# Patient Record
Sex: Male | Born: 1950 | Race: White | Hispanic: No | Smoking: Former smoker
Health system: Southern US, Community
[De-identification: ages and names within clinical notes are randomized; demographics above are authoritative.]

## PROBLEM LIST (undated history)

## (undated) DIAGNOSIS — H269 Unspecified cataract: Secondary | ICD-10-CM

## (undated) DIAGNOSIS — Z8619 Personal history of other infectious and parasitic diseases: Secondary | ICD-10-CM

## (undated) DIAGNOSIS — I10 Essential (primary) hypertension: Secondary | ICD-10-CM

## (undated) HISTORY — DX: Personal history of other infectious and parasitic diseases: Z86.19

## (undated) HISTORY — DX: Unspecified cataract: H26.9

## (undated) HISTORY — PX: EYE SURGERY: SHX253

## (undated) HISTORY — DX: Essential (primary) hypertension: I10

---

## 1999-11-16 HISTORY — PX: CHOLECYSTECTOMY: SHX55

## 2005-04-30 ENCOUNTER — Ambulatory Visit: Payer: Self-pay | Admitting: Internal Medicine

## 2015-05-01 ENCOUNTER — Ambulatory Visit (INDEPENDENT_AMBULATORY_CARE_PROVIDER_SITE_OTHER): Payer: Commercial Managed Care - HMO | Admitting: Internal Medicine

## 2015-05-01 ENCOUNTER — Encounter: Payer: Self-pay | Admitting: Internal Medicine

## 2015-05-01 ENCOUNTER — Ambulatory Visit (INDEPENDENT_AMBULATORY_CARE_PROVIDER_SITE_OTHER)
Admission: RE | Admit: 2015-05-01 | Discharge: 2015-05-01 | Disposition: A | Discharge status: Home / Self Care | Payer: Commercial Managed Care - HMO | Source: Ambulatory Visit | Attending: Internal Medicine | Admitting: Internal Medicine

## 2015-05-01 VITALS — BP 132/84 | HR 120 | Temp 100.0°F | Wt 169.0 lb

## 2015-05-01 DIAGNOSIS — R52 Pain, unspecified: Secondary | ICD-10-CM

## 2015-05-01 DIAGNOSIS — R509 Fever, unspecified: Secondary | ICD-10-CM

## 2015-05-01 DIAGNOSIS — J449 Chronic obstructive pulmonary disease, unspecified: Secondary | ICD-10-CM | POA: Diagnosis not present

## 2015-05-01 LAB — POCT RAPID STREP A (OFFICE): Rapid Strep A Screen: NEGATIVE

## 2015-05-01 LAB — POCT INFLUENZA A/B
Influenza A, POC: NEGATIVE
Influenza B, POC: NEGATIVE

## 2015-05-01 NOTE — Progress Notes (Signed)
Pre visit review using our clinic review tool, if applicable. No additional management support is needed unless otherwise documented below in the visit note. 

## 2015-05-01 NOTE — Addendum Note (Signed)
Addended by: Lurlean Nanny on: 05/01/2015 02:41 PM   Modules accepted: Orders

## 2015-05-01 NOTE — Patient Instructions (Signed)

## 2015-05-01 NOTE — Progress Notes (Signed)
Subjective:    Patient ID: Charles Smith, male    DOB: [birth year], 64 y.o.   MRN: [id number]  HPI  Pt presents to the clinic today with c/o headache, fever, chills and body aches. He reports this started 4 days ago. He denies runny nose, nasal congestion, sore throat, cough or chest congestion. He reports the headache feels like pressure in the front of his head. He denies dizziness or blurred vision. He has tried Advil and cough syrup without much relief. He has not had sick contacts that he is aware of. He did work outside all day prior to the onset of the symptoms.  Review of Systems      No past medical history on file.  Current Outpatient Prescriptions  Medication Sig Dispense Refill  . Multiple Vitamins-Minerals (CENTRUM ADULTS PO) Take 1 capsule by mouth daily.     No current facility-administered medications for this visit.    No Known Allergies  No family history on file.  History   Social History  . Marital Status: Divorced    Spouse Name: N/A  . Number of Children: N/A  . Years of Education: N/A   Occupational History  . Not on file.   Social History Main Topics  . Smoking status: Current Every Day Smoker -- 0.50 packs/day    Types: Cigarettes  . Smokeless tobacco: Not on file  . Alcohol Use: 0.0 oz/week    0 Standard drinks or equivalent per week     Comment: occasional  . Drug Use: Not on file  . Sexual Activity: Not on file   Other Topics Concern  . Not on file   Social History Narrative  . No narrative on file     Constitutional: Pt reports headache, fever, chills and body aches. Denies malaise, fatigue,or abrupt weight changes.  HEENT: Denies eye pain, eye redness, ear pain, ringing in the ears, wax buildup, runny nose, nasal congestion, bloody nose, or sore throat. Respiratory: Denies difficulty breathing, shortness of breath, cough or sputum production.   Cardiovascular: Denies chest pain, chest tightness, palpitations or swelling in  the hands or feet.  Gastrointestinal: Denies abdominal pain, bloating, constipation, diarrhea or blood in the stool.  GU: Denies urgency, frequency, pain with urination, burning sensation, blood in urine, odor or discharge.  No other specific complaints in a complete review of systems (except as listed in HPI above).  Objective:   Physical Exam  BP 132/84 mmHg  Pulse 120  Temp(Src) 100 F (37.8 C) (Oral)  Wt 169 lb (76.658 kg)  SpO2 98%   Wt Readings from Last 3 Encounters:  05/01/15 169 lb (76.658 kg)    General: Appears his stated age, well developed, well nourished in NAD. Skin: Warm, dry and intact. No rashes, lesions or ulcerations noted. HEENT: Head: normal shape and size, no sinus tendernessnoted; Eyes: sclera white, no icterus, conjunctiva pink; Ears: bilateral cerumen impaction; Nose: mucosa pink and moist, septum midline; Throat/Mouth: Teeth present, mucosa erythematous and moist, no exudate, lesions or ulcerations noted.  Neck: No adenopathy noted. Cardiovascular: Normal rate and rhythm. Diminished S1,S2 noted.  No murmur, rubs or gallops noted.  Pulmonary/Chest: Normal effort and diminshed vesicular breath sounds. No respiratory distress. No wheezes, rales or ronchi noted.        Assessment & Plan:   Fever, chills and body aches:  Rapid Flu: negative Rapid Strep: negative Will obtain chest xray to r/o pneumonia Continue Advil for now If xray is negative, likely viral, should  resolve within 7-10 days  RTC as needed or if symptoms persist or worsen

## 2015-05-12 ENCOUNTER — Telehealth: Payer: Self-pay | Admitting: Acute Care

## 2015-05-12 ENCOUNTER — Ambulatory Visit (INDEPENDENT_AMBULATORY_CARE_PROVIDER_SITE_OTHER): Payer: Commercial Managed Care - HMO | Admitting: Internal Medicine

## 2015-05-12 ENCOUNTER — Other Ambulatory Visit: Payer: Self-pay | Admitting: Acute Care

## 2015-05-12 ENCOUNTER — Encounter: Payer: Self-pay | Admitting: Internal Medicine

## 2015-05-12 VITALS — BP 148/66 | HR 109 | Temp 98.3°F | Ht 67.0 in | Wt 167.8 lb

## 2015-05-12 DIAGNOSIS — F17200 Nicotine dependence, unspecified, uncomplicated: Secondary | ICD-10-CM

## 2015-05-12 DIAGNOSIS — Z0000 Encounter for general adult medical examination without abnormal findings: Secondary | ICD-10-CM | POA: Diagnosis not present

## 2015-05-12 DIAGNOSIS — Z1211 Encounter for screening for malignant neoplasm of colon: Secondary | ICD-10-CM

## 2015-05-12 DIAGNOSIS — Z23 Encounter for immunization: Secondary | ICD-10-CM

## 2015-05-12 DIAGNOSIS — Z87891 Personal history of nicotine dependence: Secondary | ICD-10-CM

## 2015-05-12 DIAGNOSIS — Z125 Encounter for screening for malignant neoplasm of prostate: Secondary | ICD-10-CM

## 2015-05-12 DIAGNOSIS — J449 Chronic obstructive pulmonary disease, unspecified: Secondary | ICD-10-CM

## 2015-05-12 LAB — COMPREHENSIVE METABOLIC PANEL
ALBUMIN: 4.1 g/dL (ref 3.5–5.2)
ALK PHOS: 85 U/L (ref 39–117)
ALT: 37 U/L (ref 0–53)
AST: 24 U/L (ref 0–37)
BUN: 17 mg/dL (ref 6–23)
CO2: 27 meq/L (ref 19–32)
Calcium: 9.3 mg/dL (ref 8.4–10.5)
Chloride: 103 mEq/L (ref 96–112)
Creatinine, Ser: 0.88 mg/dL (ref 0.40–1.50)
GFR: 92.36 mL/min (ref >60.00)
GLUCOSE: 171 mg/dL — AB (ref 70–99)
POTASSIUM: 4.3 meq/L (ref 3.5–5.1)
SODIUM: 137 meq/L (ref 135–145)
TOTAL PROTEIN: 7.3 g/dL (ref 6.0–8.3)
Total Bilirubin: 0.6 mg/dL (ref 0.2–1.2)

## 2015-05-12 LAB — CBC
HCT: 43.4 % (ref 39.0–52.0)
Hemoglobin: 14.7 g/dL (ref 13.0–17.0)
MCHC: 33.8 g/dL (ref 30.0–36.0)
MCV: 89.7 fl (ref 78.0–100.0)
Platelets: 420 10*3/uL — ABNORMAL HIGH (ref 150.0–400.0)
RBC: 4.84 Mil/uL (ref 4.22–5.81)
RDW: 14.7 % (ref 11.5–15.5)
WBC: 13.6 10*3/uL — AB (ref 4.0–10.5)

## 2015-05-12 LAB — LIPID PANEL
CHOLESTEROL: 217 mg/dL — AB (ref 0–200)
HDL: 34.3 mg/dL — ABNORMAL LOW (ref >39.00)
NonHDL: 182.7
TRIGLYCERIDES: 353 mg/dL — AB (ref 0.0–149.0)
Total CHOL/HDL Ratio: 6
VLDL: 70.6 mg/dL — AB (ref 0.0–40.0)

## 2015-05-12 LAB — LDL CHOLESTEROL, DIRECT: Direct LDL: 121 mg/dL

## 2015-05-12 LAB — PSA, MEDICARE: PSA: 0.82 ng/ml (ref 0.10–4.00)

## 2015-05-12 NOTE — Progress Notes (Signed)
HPI  Pt presents to the clinic today to establish care. He is transferring care from [facility].  Flu: 2013 Tetanus: > 10 years ago Pneumovax: never Prevnar: never Zostovax: never PSA Screening: 2013 Colon Screening: never Vision Screening: annually Dentist: annually  Diet: He eats a lot of salads. He does not consume a lot of red meat. He eats a lot of fruits or veggies. He has been trying to cut back on soda.  Exercise: He walks twice a day, 1 mile total.   Past Medical History  Diagnosis Date  . History of chicken pox     Current Outpatient Prescriptions  Medication Sig Dispense Refill  . Multiple Vitamins-Minerals (CENTRUM ADULTS PO) Take 1 capsule by mouth daily.     No current facility-administered medications for this visit.    No Known Allergies  Family History  Problem Relation Age of Onset  . Hyperlipidemia Sister   . Heart disease Brother     heart transplant  . Kidney cancer Brother     History   Social History  . Marital Status: Divorced    Spouse Name: N/A  . Number of Children: N/A  . Years of Education: N/A   Occupational History  . Not on file.   Social History Main Topics  . Smoking status: Current Every Day Smoker -- 0.50 packs/day    Types: Cigarettes  . Smokeless tobacco: Never Used  . Alcohol Use: 0.0 oz/week    0 Standard drinks or equivalent per week     Comment: occasional  . Drug Use: No  . Sexual Activity: Not on file   Other Topics Concern  . Not on file   Social History Narrative    ROS:  Constitutional: Denies fever, malaise, fatigue, headache or abrupt weight changes.  HEENT: Denies eye pain, eye redness, ear pain, ringing in the ears, wax buildup, runny nose, nasal congestion, bloody nose, or sore throat. Respiratory: Denies difficulty breathing, shortness of breath, cough or sputum production.   Cardiovascular: Denies chest pain, chest tightness, palpitations or swelling in the hands or feet.   Gastrointestinal: Denies abdominal pain, bloating, constipation, diarrhea or blood in the stool.  GU: Denies frequency, urgency, pain with urination, blood in urine, odor or discharge. Musculoskeletal: Denies decrease in range of motion, difficulty with gait, muscle pain or joint pain and swelling.  Skin: Denies redness, rashes, lesions or ulcercations.  Neurological: Denies dizziness, difficulty with memory, difficulty with speech or problems with balance and coordination.  Psych: Denies anxiety, depression, SI/HI.  No other specific complaints in a complete review of systems (except as listed in HPI above).  PE:  BP 148/66 mmHg  Pulse 109  Temp(Src) 98.3 F (36.8 C) (Oral)  Ht 5\' 7"  (1.702 m)  Wt 167 lb 12 oz (76.091 kg)  BMI 26.27 kg/m2  SpO2 97% Wt Readings from Last 3 Encounters:  05/12/15 167 lb 12 oz (76.091 kg)  05/01/15 169 lb (76.658 kg)    General: Appears his stated age, well developed, well nourished in NAD. HEENT: Head: normal shape and size; Eyes: sclera white, no icterus, conjunctiva pink, PERRLA and EOMs intact; Ears: Tm's gray and intact, normal light reflex; Throat/Mouth: Teeth present, mucosa pink and moist, no lesions or ulcerations noted.  Neck: Neck supple, trachea midline. No masses, lumps or thyromegaly present.  Cardiovascular: Normal rate and rhythm. S1,S2 noted.  No murmur, rubs or gallops noted. No JVD or BLE edema. No carotid bruits noted. Pulmonary/Chest: Normal effort and coarse vesicular breath  sounds. No respiratory distress. No wheezes, rales or ronchi noted.  Abdomen: Soft and nontender. Normal bowel sounds, no bruits noted. No distention or masses noted. Liver, spleen and kidneys non palpable. Musculoskeletal: Normal range of motion. Strength 5/5 BUE/BLE. No difficulty with gait.  Neurological: Alert and oriented. Cranial nerves II-XII grossly intact. Coordination normal. Psychiatric: Mood and affect normal. Behavior is normal. Judgment and  thought content normal.     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

## 2015-05-12 NOTE — Addendum Note (Signed)
Addended by: Ellamae Sia on: 05/12/2015 01:33 PM   Modules accepted: Orders

## 2015-05-12 NOTE — Progress Notes (Signed)
Pre visit review using our clinic review tool, if applicable. No additional management support is needed unless otherwise documented below in the visit note. 

## 2015-05-12 NOTE — Patient Instructions (Addendum)
You are getting a Tetanus injection and Prevnar today We are checking labs today, will call you with the results  Health Maintenance A healthy lifestyle and preventative care can promote health and wellness.  Maintain regular health, dental, and eye exams.  Eat a healthy diet. Foods like vegetables, fruits, whole grains, low-fat dairy products, and lean protein foods contain the nutrients you need and are low in calories. Decrease your intake of foods high in solid fats, added sugars, and salt. Get information about a proper diet from your health care provider, if necessary.  Regular physical exercise is one of the most important things you can do for your health. Most adults should get at least 150 minutes of moderate-intensity exercise (any activity that increases your heart rate and causes you to sweat) each week. In addition, most adults need muscle-strengthening exercises on 2 or more days a week.   Maintain a healthy weight. The body mass index (BMI) is a screening tool to identify possible weight problems. It provides an estimate of body fat based on height and weight. Your health care provider can find your BMI and can help you achieve or maintain a healthy weight. For males 20 years and older:  A BMI below 18.5 is considered underweight.  A BMI of 18.5 to 24.9 is normal.  A BMI of 25 to 29.9 is considered overweight.  A BMI of 30 and above is considered obese.  Maintain normal blood lipids and cholesterol by exercising and minimizing your intake of saturated fat. Eat a balanced diet with plenty of fruits and vegetables. Blood tests for lipids and cholesterol should begin at age 1 and be repeated every 5 years. If your lipid or cholesterol levels are high, you are over age 30, or you are at high risk for heart disease, you may need your cholesterol levels checked more frequently.Ongoing high lipid and cholesterol levels should be treated with medicines if diet and exercise are not  working.  If you smoke, find out from your health care provider how to quit. If you do not use tobacco, do not start.  Lung cancer screening is recommended for adults aged 7-80 years who are at high risk for developing lung cancer because of a history of smoking. A yearly low-dose CT scan of the lungs is recommended for people who have at least a 30-pack-year history of smoking and are current smokers or have quit within the past 15 years. A pack year of smoking is smoking an average of 1 pack of cigarettes a day for 1 year (for example, a 30-pack-year history of smoking could mean smoking 1 pack a day for 30 years or 2 packs a day for 15 years). Yearly screening should continue until the smoker has stopped smoking for at least 15 years. Yearly screening should be stopped for people who develop a health problem that would prevent them from having lung cancer treatment.  If you choose to drink alcohol, do not have more than 2 drinks per day. One drink is considered to be 12 oz (360 mL) of beer, 5 oz (150 mL) of wine, or 1.5 oz (45 mL) of liquor.  Avoid the use of street drugs. Do not share needles with anyone. Ask for help if you need support or instructions about stopping the use of drugs.  High blood pressure causes heart disease and increases the risk of stroke. Blood pressure should be checked at least every 1-2 years. Ongoing high blood pressure should be treated with medicines  if weight loss and exercise are not effective.  If you are 52-83 years old, ask your health care provider if you should take aspirin to prevent heart disease.  Diabetes screening involves taking a blood sample to check your fasting blood sugar level. This should be done once every 3 years after age 37 if you are at a normal weight and without risk factors for diabetes. Testing should be considered at a younger age or be carried out more frequently if you are overweight and have at least 1 risk factor for  diabetes.  Colorectal cancer can be detected and often prevented. Most routine colorectal cancer screening begins at the age of 32 and continues through age 24. However, your health care provider may recommend screening at an earlier age if you have risk factors for colon cancer. On a yearly basis, your health care provider may provide home test kits to check for hidden blood in the stool. A small camera at the end of a tube may be used to directly examine the colon (sigmoidoscopy or colonoscopy) to detect the earliest forms of colorectal cancer. Talk to your health care provider about this at age 76 when routine screening begins. A direct exam of the colon should be repeated every 5-10 years through age 73, unless early forms of precancerous polyps or small growths are found.  People who are at an increased risk for hepatitis B should be screened for this virus. You are considered at high risk for hepatitis B if:  You were born in a country where hepatitis B occurs often. Talk with your health care provider about which countries are considered high risk.  Your parents were born in a high-risk country and you have not received a shot to protect against hepatitis B (hepatitis B vaccine).  You have HIV or AIDS.  You use needles to inject street drugs.  You live with, or have sex with, someone who has hepatitis B.  You are a man who has sex with other men (MSM).  You get hemodialysis treatment.  You take certain medicines for conditions like cancer, organ transplantation, and autoimmune conditions.  Hepatitis C blood testing is recommended for all people born from [birth year] through 1966 and any individual with known risk factors for hepatitis C.  Healthy men should no longer receive prostate-specific antigen (PSA) blood tests as part of routine cancer screening. Talk to your health care provider about prostate cancer screening.  Testicular cancer screening is not recommended for adolescents or  adult males who have no symptoms. Screening includes self-exam, a health care provider exam, and other screening tests. Consult with your health care provider about any symptoms you have or any concerns you have about testicular cancer.  Practice safe sex. Use condoms and avoid high-risk sexual practices to reduce the spread of sexually transmitted infections (STIs).  You should be screened for STIs, including gonorrhea and chlamydia if:  You are sexually active and are younger than 24 years.  You are older than 24 years, and your health care provider tells you that you are at risk for this type of infection.  Your sexual activity has changed since you were last screened, and you are at an increased risk for chlamydia or gonorrhea. Ask your health care provider if you are at risk.  If you are at risk of being infected with HIV, it is recommended that you take a prescription medicine daily to prevent HIV infection. This is called pre-exposure prophylaxis (PrEP). You are considered at  risk if:  You are a man who has sex with other men (MSM).  You are a heterosexual man who is sexually active with multiple partners.  You take drugs by injection.  You are sexually active with a partner who has HIV.  Talk with your health care provider about whether you are at high risk of being infected with HIV. If you choose to begin PrEP, you should first be tested for HIV. You should then be tested every 3 months for as long as you are taking PrEP.  Use sunscreen. Apply sunscreen liberally and repeatedly throughout the day. You should seek shade when your shadow is shorter than you. Protect yourself by wearing long sleeves, pants, a wide-brimmed hat, and sunglasses year round whenever you are outdoors.  Tell your health care provider of new moles or changes in moles, especially if there is a change in shape or color. Also, tell your health care provider if a mole is larger than the size of a pencil  eraser.  A one-time screening for abdominal aortic aneurysm (AAA) and surgical repair of large AAAs by ultrasound is recommended for men aged 59-75 years who are current or former smokers.  Stay current with your vaccines (immunizations). Document Released: 04/29/2008 Document Revised: 11/06/2013 Document Reviewed: 03/29/2011 [facility] Patient Information 2015 [place], [place]. This information is not intended to replace advice given to you by your health care provider. Make sure you discuss any questions you have with your health care provider.

## 2015-05-12 NOTE — Telephone Encounter (Signed)
I called to get Charles Smith scheduled for his Lung Cancer Screening ( referrral from Webb Silversmith, NP). Initially he was ready yo schedule the visit, but then when we were talking about his smoking history he asked me if we could postpone the appointment and scan. He said he just lost his brother from back and kidney cancer, and needed to get over that before getting scanned. I told him I would call him in 6 months to see if he was ready to be screened at that time. He said he would appreciate that. I have him on my schedule to call in 6 months. I will let Webb Silversmith know.

## 2015-05-21 ENCOUNTER — Other Ambulatory Visit (INDEPENDENT_AMBULATORY_CARE_PROVIDER_SITE_OTHER): Payer: Commercial Managed Care - HMO

## 2015-05-21 DIAGNOSIS — Z1211 Encounter for screening for malignant neoplasm of colon: Secondary | ICD-10-CM | POA: Diagnosis not present

## 2015-05-21 LAB — FECAL OCCULT BLOOD, IMMUNOCHEMICAL: Fecal Occult Bld: NEGATIVE

## 2015-05-27 ENCOUNTER — Encounter: Payer: Commercial Managed Care - HMO | Admitting: Acute Care

## 2015-08-29 ENCOUNTER — Encounter: Payer: Self-pay | Admitting: Internal Medicine

## 2015-08-29 MED ORDER — SILDENAFIL CITRATE 20 MG PO TABS: ORAL_TABLET | ORAL | Status: DC

## 2015-10-29 DIAGNOSIS — H43811 Vitreous degeneration, right eye: Secondary | ICD-10-CM | POA: Diagnosis not present

## 2015-11-13 NOTE — Telephone Encounter (Signed)
Called and spoke with patient. Patient stated this was not needed. Patient refused to make appointment. Patient stated he would call us when he was ready.  Nothing further needed at this time. Will inform Webb Silversmith of this information.

## 2015-12-22 IMAGING — CR DG CHEST 2V
2 series · 2 of 2 positions shown · non-contrast
Comparison: None.

CLINICAL DATA: Fever and chills, body aches.

EXAM:
CHEST  2 VIEW

[view not recorded (1 of 2)]
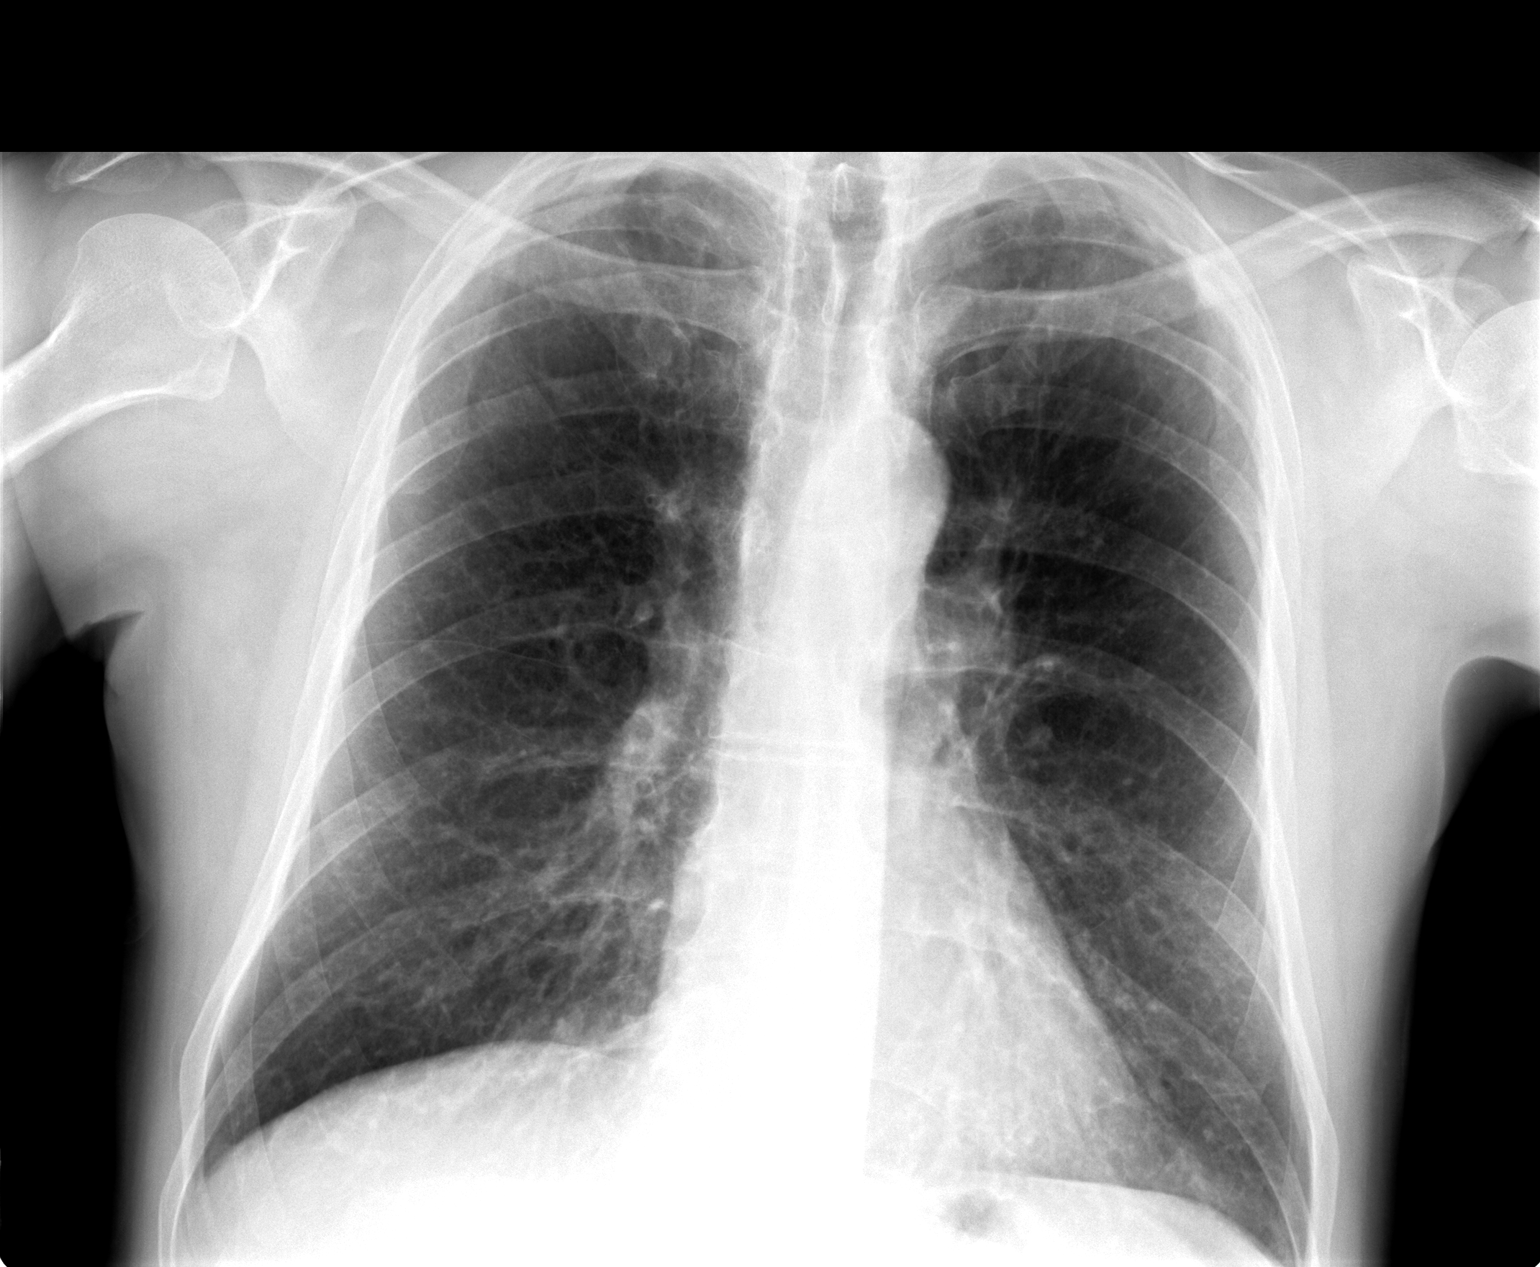

[view not recorded (2 of 2)]
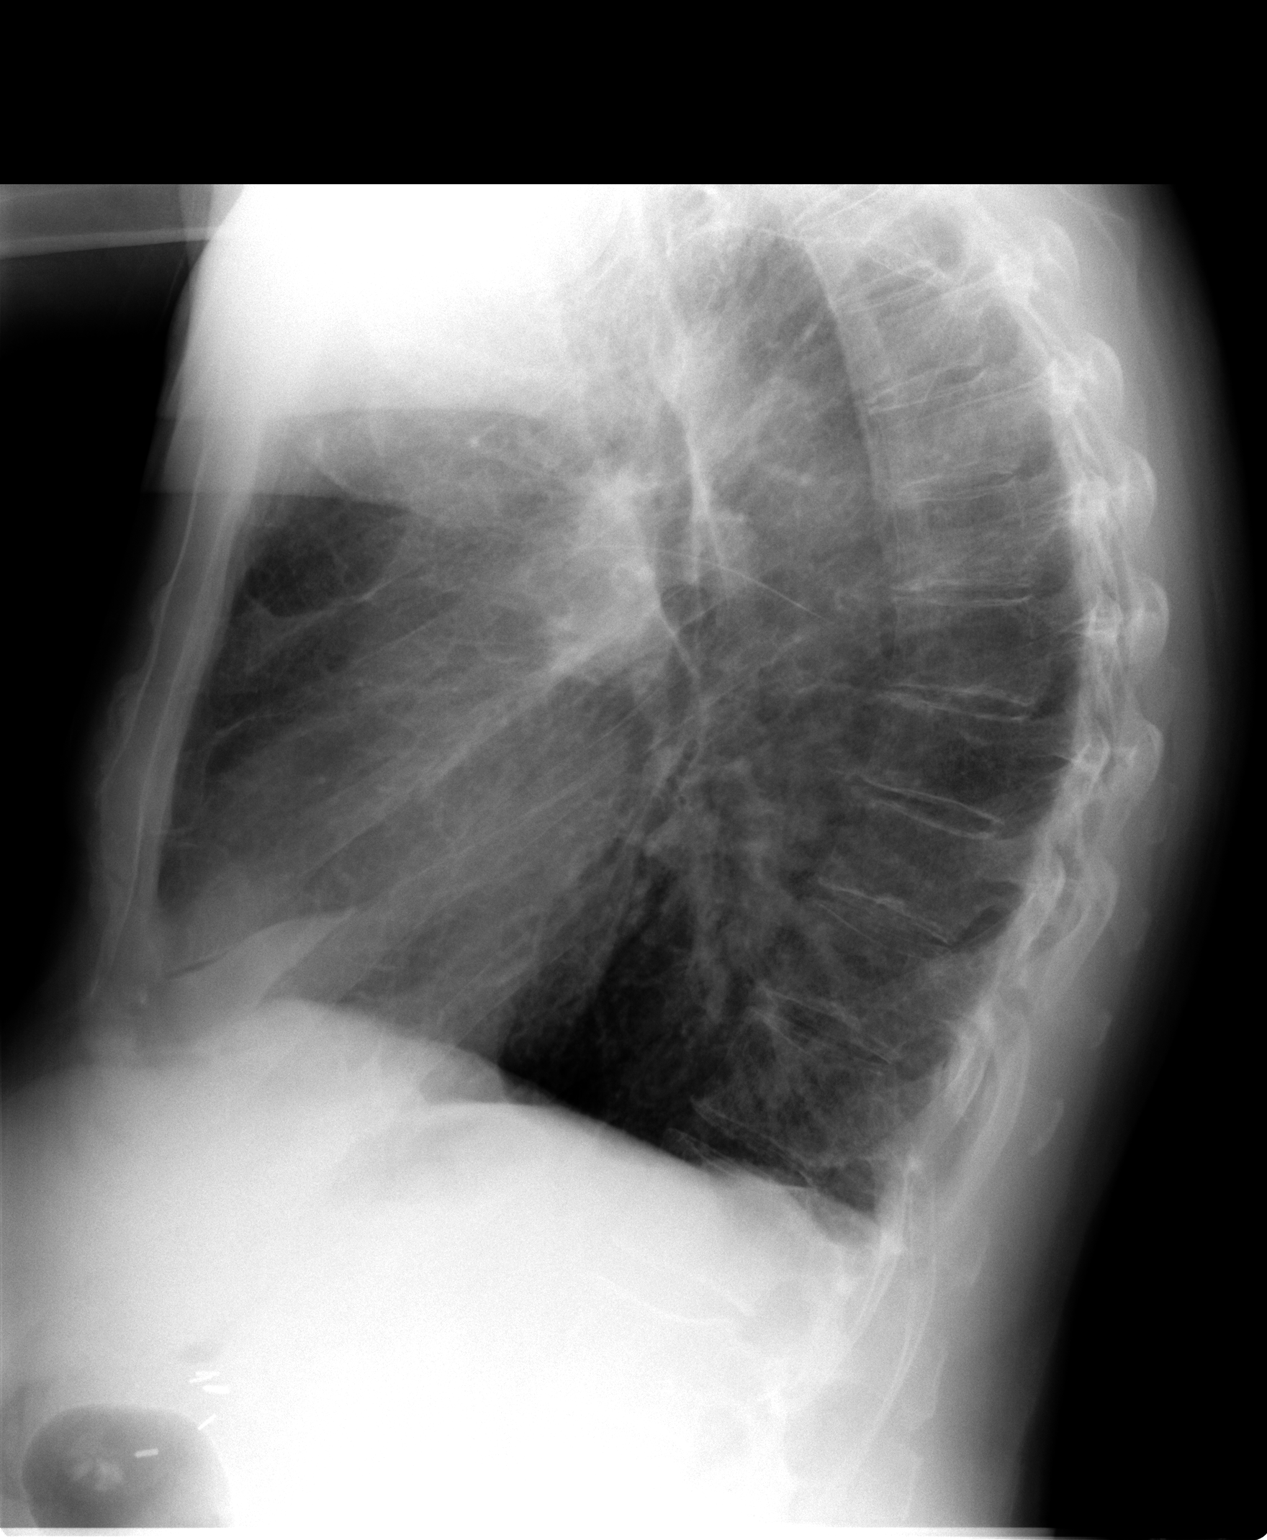

[2 of 2 positions shown; findings below may reference images not displayed]

FINDINGS: The heart size and mediastinal contours are within normal limits. No
pneumothorax or pleural effusion is noted. Mild biapical scarring is
noted. No acute pulmonary disease is noted. Hyperexpansion of the
lungs is noted suggesting chronic obstructive pulmonary disease. The
visualized skeletal structures are unremarkable.
IMPRESSION: No active cardiopulmonary disease.

## 2016-03-17 ENCOUNTER — Other Ambulatory Visit: Payer: Self-pay | Admitting: Internal Medicine

## 2016-03-18 MED ORDER — SILDENAFIL CITRATE 20 MG PO TABS: ORAL_TABLET | ORAL | Status: DC

## 2016-03-18 NOTE — Telephone Encounter (Signed)
Last filled 08/2015--last OV CPE 04/2015--please advise

## 2016-06-24 DIAGNOSIS — H52202 Unspecified astigmatism, left eye: Secondary | ICD-10-CM | POA: Diagnosis not present

## 2016-06-24 DIAGNOSIS — H5210 Myopia, unspecified eye: Secondary | ICD-10-CM | POA: Diagnosis not present

## 2016-06-24 DIAGNOSIS — H5213 Myopia, bilateral: Secondary | ICD-10-CM | POA: Diagnosis not present

## 2016-10-22 ENCOUNTER — Encounter: Payer: Commercial Managed Care - HMO | Admitting: Internal Medicine

## 2016-10-26 ENCOUNTER — Other Ambulatory Visit: Payer: Self-pay | Admitting: Internal Medicine

## 2016-10-26 DIAGNOSIS — K045 Chronic apical periodontitis: Secondary | ICD-10-CM | POA: Diagnosis not present

## 2016-12-10 ENCOUNTER — Ambulatory Visit (INDEPENDENT_AMBULATORY_CARE_PROVIDER_SITE_OTHER): Payer: Medicare HMO | Admitting: Internal Medicine

## 2016-12-10 ENCOUNTER — Encounter: Payer: Self-pay | Admitting: Internal Medicine

## 2016-12-10 VITALS — BP 168/100 | HR 85 | Temp 97.9°F | Ht 66.75 in | Wt 178.0 lb

## 2016-12-10 DIAGNOSIS — Z0000 Encounter for general adult medical examination without abnormal findings: Secondary | ICD-10-CM

## 2016-12-10 DIAGNOSIS — Z23 Encounter for immunization: Secondary | ICD-10-CM | POA: Diagnosis not present

## 2016-12-10 DIAGNOSIS — I10 Essential (primary) hypertension: Secondary | ICD-10-CM | POA: Insufficient documentation

## 2016-12-10 DIAGNOSIS — Z1159 Encounter for screening for other viral diseases: Secondary | ICD-10-CM

## 2016-12-10 DIAGNOSIS — E7800 Pure hypercholesterolemia, unspecified: Secondary | ICD-10-CM

## 2016-12-10 DIAGNOSIS — H6123 Impacted cerumen, bilateral: Secondary | ICD-10-CM

## 2016-12-10 DIAGNOSIS — D229 Melanocytic nevi, unspecified: Secondary | ICD-10-CM

## 2016-12-10 DIAGNOSIS — N529 Male erectile dysfunction, unspecified: Secondary | ICD-10-CM | POA: Insufficient documentation

## 2016-12-10 DIAGNOSIS — H00015 Hordeolum externum left lower eyelid: Secondary | ICD-10-CM

## 2016-12-10 DIAGNOSIS — J449 Chronic obstructive pulmonary disease, unspecified: Secondary | ICD-10-CM

## 2016-12-10 DIAGNOSIS — Z125 Encounter for screening for malignant neoplasm of prostate: Secondary | ICD-10-CM

## 2016-12-10 LAB — CBC
HCT: 40.8 % (ref 39.0–52.0)
Hemoglobin: 14.1 g/dL (ref 13.0–17.0)
MCHC: 34.5 g/dL (ref 30.0–36.0)
MCV: 89.4 fl (ref 78.0–100.0)
Platelets: 280 10*3/uL (ref 150.0–400.0)
RBC: 4.56 Mil/uL (ref 4.22–5.81)
RDW: 14.4 % (ref 11.5–15.5)
WBC: 10.3 10*3/uL (ref 4.0–10.5)

## 2016-12-10 LAB — LIPID PANEL
CHOL/HDL RATIO: 7
Cholesterol: 289 mg/dL — ABNORMAL HIGH (ref 0–200)
HDL: 43.2 mg/dL (ref >39.00)
NONHDL: 246.01
Triglycerides: 395 mg/dL — ABNORMAL HIGH (ref 0.0–149.0)
VLDL: 79 mg/dL — ABNORMAL HIGH (ref 0.0–40.0)

## 2016-12-10 LAB — COMPREHENSIVE METABOLIC PANEL
ALBUMIN: 4.7 g/dL (ref 3.5–5.2)
ALT: 29 U/L (ref 0–53)
AST: 25 U/L (ref 0–37)
Alkaline Phosphatase: 69 U/L (ref 39–117)
BUN: 22 mg/dL (ref 6–23)
CHLORIDE: 106 meq/L (ref 96–112)
CO2: 28 meq/L (ref 19–32)
CREATININE: 0.98 mg/dL (ref 0.40–1.50)
Calcium: 9.9 mg/dL (ref 8.4–10.5)
GFR: 81.17 mL/min (ref >60.00)
GLUCOSE: 96 mg/dL (ref 70–99)
Potassium: 5 mEq/L (ref 3.5–5.1)
SODIUM: 141 meq/L (ref 135–145)
Total Bilirubin: 0.7 mg/dL (ref 0.2–1.2)
Total Protein: 7.7 g/dL (ref 6.0–8.3)

## 2016-12-10 LAB — LDL CHOLESTEROL, DIRECT: LDL DIRECT: 148 mg/dL

## 2016-12-10 LAB — PSA, MEDICARE: PSA: 0.93 ng/mL (ref 0.10–4.00)

## 2016-12-10 MED ORDER — LOSARTAN POTASSIUM 25 MG PO TABS: 25.0000 mg | ORAL_TABLET | Freq: Every day | ORAL | 0 refills | Status: DC

## 2016-12-10 NOTE — Addendum Note (Signed)
Addended by: Jearld Fenton on: 12/10/2016 03:30 PM   Modules accepted: Orders

## 2016-12-10 NOTE — Patient Instructions (Signed)

## 2016-12-10 NOTE — Assessment & Plan Note (Signed)
Improved since he stopped smoking Ok to continue Revatio prn Will monitor

## 2016-12-10 NOTE — Progress Notes (Signed)
HPI:  Pt presents to the clinic today for his Medicare Wellness Exam. He is also due for follow up of chronic conditions:  COLD: He denies cough or shortness of breath. He is not currently using any inhalers OTC. He quit smoking the day before thanksgiving.  ED: He reports this has actually gotten better since he stopped smoking. He is taking Revatio as needed.  Elevated blood Pressure: He reports he has never been diagnosed with HTN. His BP today is 168/100. Previous BP's are 148/66, 132/84. He denies chest pain, chest tightness or shorntess of breath.  Past Medical History:  Diagnosis Date  . History of chicken pox     Current Outpatient Prescriptions  Medication Sig Dispense Refill  . Multiple Vitamins-Minerals (CENTRUM ADULTS PO) Take 1 capsule by mouth daily.    . sildenafil (REVATIO) 20 MG tablet Take 2-5 tablets as needed for sexual encounter. MUST SCHEDULE ANNUAL EXAM 50 tablet 0   No current facility-administered medications for this visit.     No Known Allergies  Family History  Problem Relation Age of Onset  . Hyperlipidemia Sister   . Heart disease Brother     heart transplant  . Kidney cancer Brother   . Depression Neg Hx   . Stroke Neg Hx     Social History   Social History  . Marital status: Divorced    Spouse name: N/A  . Number of children: N/A  . Years of education: N/A   Occupational History  . Not on file.   Social History Main Topics  . Smoking status: Current Every Day Smoker    Packs/day: 0.50    Years: 30.00    Types: Cigarettes  . Smokeless tobacco: Never Used  . Alcohol use 0.0 oz/week     Comment: occasional  . Drug use: No  . Sexual activity: Yes   Other Topics Concern  . Not on file   Social History Narrative  . No narrative on file    Hospitiliaztions: None  Health Maintenance:    Flu: wants one today  Tetanus: 04/2015  Pneumovax: 04/2015  Prevnar: never  Zostavax: never  PSA: 04/2015  Colon Screening: 05/2015  (IFOB)  Eye Doctor: 05/2016, Dr. Thom Chimes  Dental Exam: biannually   Providers:   PCP: Webb Silversmith, NP-C   I have personally reviewed and have noted:  1. The patient's medical and social history 2. Their use of alcohol, tobacco or illicit drugs 3. Their current medications and supplements 4. The patient's functional ability including ADL's, fall risks, home safety risks and hearing or visual impairment. 5. Diet and physical activities 6. Evidence for depression or mood disorder  Subjective:   Review of Systems:   Constitutional: Denies fever, malaise, fatigue, headache or abrupt weight changes.  HEENT: Denies eye pain, eye redness, ear pain, ringing in the ears, wax buildup, runny nose, nasal congestion, bloody nose, or sore throat. Respiratory: Denies difficulty breathing, shortness of breath, cough or sputum production.   Cardiovascular: Denies chest pain, chest tightness, palpitations or swelling in the hands or feet.  Gastrointestinal: Denies abdominal pain, bloating, constipation, diarrhea or blood in the stool.  GU: Denies urgency, frequency, pain with urination, burning sensation, blood in urine, odor or discharge. Musculoskeletal: Denies decrease in range of motion, difficulty with gait, muscle pain or joint pain and swelling.  Skin: Denies redness, rashes, lesions or ulcercations.  Neurological: Denies dizziness, difficulty with memory, difficulty with speech or problems with balance and coordination.  Psych: Denies anxiety, depression,  SI/HI.  No other specific complaints in a complete review of systems (except as listed in HPI above).  Objective:  PE:   BP (!) 164/100 (BP Location: Left Arm, Patient Position: Sitting)   Pulse 85   Temp 97.9 F (36.6 C)   Ht 5' 6.75" (1.695 m)   Wt 178 lb (80.7 kg)   SpO2 94%   BMI 28.09 kg/m   Wt Readings from Last 3 Encounters:  05/12/15 167 lb 12 oz (76.1 kg)  05/01/15 169 lb (76.7 kg)    General: Appears his stated  age, well developed, well nourished in NAD. Skin: Warm, dry and intact. He has an external stye to his left lower lid. He has a mole on his left foreram, with some black discoloration.   HEENT: Head: normal shape and size; Eyes: sclera white, no icterus, conjunctiva pink, PERRLA and EOMs intact; Ears: bilateral cerumen impaction; Throat/Mouth: Teeth present, mucosa pink and moist, no exudate, lesions or ulcerations noted.  Neck: Neck supple, trachea midline. No masses, lumps or thyromegaly present.  Cardiovascular: Normal rate and rhythm. S1,S2 noted.  No murmur, rubs or gallops noted. No JVD or BLE edema. No carotid bruits noted. Pulmonary/Chest: Normal effort and positive vesicular breath sounds. No respiratory distress. No wheezes, rales or ronchi noted.  Abdomen: Soft and nontender. Normal bowel sounds. No distention or masses noted. Liver, spleen and kidneys non palpable. Musculoskeletal: Strength 5/5 BUE/BLE. No signs of joint swelling.  Neurological: Alert and oriented. Cranial nerves II-XII grossly intact. Coordination normal.  Psychiatric: Mood and affect normal. Behavior is normal. Judgment and thought content normal.    BMET    Component Value Date/Time   NA 137 05/12/2015 1107   K 4.3 05/12/2015 1107   CL 103 05/12/2015 1107   CO2 27 05/12/2015 1107   GLUCOSE 171 (H) 05/12/2015 1107   BUN 17 05/12/2015 1107   CREATININE 0.88 05/12/2015 1107   CALCIUM 9.3 05/12/2015 1107    Lipid Panel     Component Value Date/Time   CHOL 217 (H) 05/12/2015 1107   TRIG 353.0 (H) 05/12/2015 1107   HDL 34.30 (L) 05/12/2015 1107   CHOLHDL 6 05/12/2015 1107   VLDL 70.6 (H) 05/12/2015 1107    CBC    Component Value Date/Time   WBC 13.6 (H) 05/12/2015 1107   RBC 4.84 05/12/2015 1107   HGB 14.7 05/12/2015 1107   HCT 43.4 05/12/2015 1107   PLT 420.0 (H) 05/12/2015 1107   MCV 89.7 05/12/2015 1107   MCHC 33.8 05/12/2015 1107   RDW 14.7 05/12/2015 1107    Hgb A1C No results found for:  HGBA1C    Assessment and Plan:   Medicare Annual Wellness Visit:  Diet: He does eat meat. He consumes fruits and veggies most day. He does eat some fried food. He drinks mostly Coke and water. Physical activity: Walking 2-3 times a week for 15 minutes Depression/mood screen: Negative Hearing: Intact to whispered voice Visual acuity: Grossly normal, performs annual eye exam  ADLs: Capable Fall risk: None Home safety: Good Cognitive evaluation: Intact to orientation, naming, recall and repetition EOL planning: No adv directives, full code/ I agree  Preventative Medicine: Flu and pneumovax given today. Tetanus UTD. He will call [employer] to see about coverage for shingles vaccine. He declines colonoscopy but is agreeable to Cologuard- ordered. Encouraged him to consume a balanced diet and exercise regimen. Advised him to see an eye doctor and dentist annually. Will check CBC, CMET, Lipid, PSA and Hep C  today.   Next appointment: 3 week, follow up HTN  Abnormal mole of left arm, stye of left lower lid:  He will make an appt with derm for further evaluation  Bilateral cerumen impaction:  Manual lavage by CMA Advised him to use Debrox OTC 2 x week to prevent buildup  Maclean Foister, NP

## 2016-12-10 NOTE — Assessment & Plan Note (Signed)
He has stopped smoking, congratulated him on this No intervention needed at this time Will monitor

## 2016-12-10 NOTE — Assessment & Plan Note (Signed)
New onset Discussed low salt diet and exercising to lose weight eRx for Losartan 25 mg daily  RTC in 3 weeks for follow up HTN

## 2016-12-11 LAB — HEPATITIS C ANTIBODY: HCV AB: NEGATIVE

## 2016-12-14 ENCOUNTER — Telehealth: Payer: Self-pay

## 2016-12-14 NOTE — Telephone Encounter (Signed)
Order has been faxed with demographics and insurance cards

## 2016-12-22 MED ORDER — SIMVASTATIN 10 MG PO TABS: 10.0000 mg | ORAL_TABLET | Freq: Every day | ORAL | 2 refills | Status: DC

## 2016-12-22 NOTE — Addendum Note (Signed)
Addended by: Jearld Fenton on: 12/22/2016 11:34 AM   Modules accepted: Orders

## 2017-01-03 ENCOUNTER — Encounter: Payer: Self-pay | Admitting: Internal Medicine

## 2017-01-03 ENCOUNTER — Ambulatory Visit (INDEPENDENT_AMBULATORY_CARE_PROVIDER_SITE_OTHER): Payer: Medicare HMO | Admitting: Internal Medicine

## 2017-01-03 DIAGNOSIS — I10 Essential (primary) hypertension: Secondary | ICD-10-CM

## 2017-01-03 DIAGNOSIS — Z1212 Encounter for screening for malignant neoplasm of rectum: Secondary | ICD-10-CM | POA: Diagnosis not present

## 2017-01-03 DIAGNOSIS — Z1211 Encounter for screening for malignant neoplasm of colon: Secondary | ICD-10-CM | POA: Diagnosis not present

## 2017-01-03 LAB — COLOGUARD: COLOGUARD: NEGATIVE

## 2017-01-03 MED ORDER — LOSARTAN POTASSIUM 50 MG PO TABS: 50.0000 mg | ORAL_TABLET | Freq: Every day | ORAL | 0 refills | Status: DC

## 2017-01-03 NOTE — Assessment & Plan Note (Signed)
Better but not at goal Increase Losartan to 50 mg daily, eRx sent to pharmacy  RTC in 3 weeks for HTN followup

## 2017-01-03 NOTE — Progress Notes (Signed)
Subjective:    Patient ID: Charles Smith, male    DOB: 11-07-1951, 66 y.o.   MRN: [id number]  HPI  Pt presents to the clinic today for 3 week follow up of HTN. At his last visit, his BP was 168/100. He was started on Losartan 25 mg. He has been taking the medication as prescribed. He did noticed that he wa lightheaded and had loose stools the first couple of days, but that has resolved. His BP today is 148/88. There is no ECG on file.  Review of Systems  Past Medical History:  Diagnosis Date  . History of chicken pox     Current Outpatient Prescriptions  Medication Sig Dispense Refill  . losartan (COZAAR) 25 MG tablet Take 1 tablet (25 mg total) by mouth daily. 30 tablet 0  . Multiple Vitamins-Minerals (CENTRUM ADULTS PO) Take 1 capsule by mouth daily.    . Omega-3 Fatty Acids (FISH OIL) 1000 MG CAPS Take 1,000 mg by mouth 2 (two) times daily.    . sildenafil (REVATIO) 20 MG tablet Take 2-5 tablets as needed for sexual encounter. MUST SCHEDULE ANNUAL EXAM 50 tablet 0  . simvastatin (ZOCOR) 10 MG tablet Take 1 tablet (10 mg total) by mouth at bedtime. 30 tablet 2   No current facility-administered medications for this visit.     No Known Allergies  Family History  Problem Relation Age of Onset  . Hyperlipidemia Sister   . Heart disease Brother     heart transplant  . Kidney cancer Brother   . Depression Neg Hx   . Stroke Neg Hx     Social History   Social History  . Marital status: Divorced    Spouse name: N/A  . Number of children: N/A  . Years of education: N/A   Occupational History  . Not on file.   Social History Main Topics  . Smoking status: Former Smoker    Types: Cigarettes  . Smokeless tobacco: Never Used  . Alcohol use 0.0 oz/week     Comment: occasional  . Drug use: No  . Sexual activity: Yes   Other Topics Concern  . Not on file   Social History Narrative  . No narrative on file     Constitutional: Denies fever, malaise, fatigue,  headache or abrupt weight changes.  Respiratory: Denies difficulty breathing, shortness of breath, cough or sputum production.   Cardiovascular: Denies chest pain, chest tightness, palpitations or swelling in the hands or feet.  Neurological: Denies dizziness, difficulty with memory, difficulty with speech or problems with balance and coordination.    No other specific complaints in a complete review of systems (except as listed in HPI above).     Objective:   Physical Exam   BP (!) 148/88   Pulse 89   Temp 98.1 F (36.7 C) (Oral)   Wt 182 lb (82.6 kg)   SpO2 97%   BMI 28.72 kg/m  Wt Readings from Last 3 Encounters:  01/03/17 182 lb (82.6 kg)  12/10/16 178 lb (80.7 kg)  05/12/15 167 lb 12 oz (76.1 kg)    General: Appears his stated age, in NAD. Cardiovascular: Normal rate and rhythm. S1,S2 noted.   Pulmonary/Chest: Normal effort and positive vesicular breath sounds. No respiratory distress. No wheezes, rales or ronchi noted.   BMET    Component Value Date/Time   NA 141 12/10/2016 1517   K 5.0 12/10/2016 1517   CL 106 12/10/2016 1517   CO2 28 12/10/2016 1517  GLUCOSE 96 12/10/2016 1517   BUN 22 12/10/2016 1517   CREATININE 0.98 12/10/2016 1517   CALCIUM 9.9 12/10/2016 1517    Lipid Panel     Component Value Date/Time   CHOL 289 (H) 12/10/2016 1517   TRIG 395.0 (H) 12/10/2016 1517   HDL 43.20 12/10/2016 1517   CHOLHDL 7 12/10/2016 1517   VLDL 79.0 (H) 12/10/2016 1517    CBC    Component Value Date/Time   WBC 10.3 12/10/2016 1517   RBC 4.56 12/10/2016 1517   HGB 14.1 12/10/2016 1517   HCT 40.8 12/10/2016 1517   PLT 280.0 12/10/2016 1517   MCV 89.4 12/10/2016 1517   MCHC 34.5 12/10/2016 1517   RDW 14.4 12/10/2016 1517    Hgb A1C No results found for: HGBA1C         Assessment & Plan:

## 2017-01-03 NOTE — Patient Instructions (Signed)
Hypertension Hypertension is another name for high blood pressure. High blood pressure forces your heart to work harder to pump blood. A blood pressure reading has two numbers, which includes a higher number over a lower number (example: 110/72). Follow these instructions at home:  Have your blood pressure rechecked by your doctor.  Only take medicine as told by your doctor. Follow the directions carefully. The medicine does not work as well if you skip doses. Skipping doses also puts you at risk for problems.  Do not smoke.  Monitor your blood pressure at home as told by your doctor. Contact a doctor if:  You think you are having a reaction to the medicine you are taking.  You have repeat headaches or feel dizzy.  You have puffiness (swelling) in your ankles.  You have trouble with your vision. Get help right away if:  You get a very bad headache and are confused.  You feel weak, numb, or faint.  You get chest or belly (abdominal) pain.  You throw up (vomit).  You cannot breathe very well. This information is not intended to replace advice given to you by your health care provider. Make sure you discuss any questions you have with your health care provider. Document Released: 04/19/2008 Document Revised: 04/08/2016 Document Reviewed: 08/24/2013 Elsevier Interactive Patient Education  2017 [employer] Inc[employer]  

## 2017-01-07 ENCOUNTER — Encounter: Payer: Self-pay | Admitting: Internal Medicine

## 2017-01-27 ENCOUNTER — Encounter: Payer: Self-pay | Admitting: Internal Medicine

## 2017-01-27 ENCOUNTER — Ambulatory Visit (INDEPENDENT_AMBULATORY_CARE_PROVIDER_SITE_OTHER): Payer: Medicare HMO | Admitting: Internal Medicine

## 2017-01-27 DIAGNOSIS — I10 Essential (primary) hypertension: Secondary | ICD-10-CM

## 2017-01-27 MED ORDER — LOSARTAN POTASSIUM 25 MG PO TABS: 25.0000 mg | ORAL_TABLET | Freq: Every day | ORAL | 0 refills | Status: DC

## 2017-01-27 MED ORDER — LOSARTAN POTASSIUM 50 MG PO TABS: 50.0000 mg | ORAL_TABLET | Freq: Every day | ORAL | 0 refills | Status: DC

## 2017-01-27 NOTE — Assessment & Plan Note (Signed)
Better but still not at goal Increase Losartan to 75 mg daily, new RX sent to pharmacy  RTC in 3 weeks for follow up HTN

## 2017-01-27 NOTE — Patient Instructions (Signed)
Hypertension °Hypertension is another name for high blood pressure. High blood pressure forces your heart to work harder to pump blood. This can cause problems over time. °There are two numbers in a blood pressure reading. There is a top number (systolic) over a bottom number (diastolic). It is best to have a blood pressure below 120/80. Healthy choices can help lower your blood pressure. You may need medicine to help lower your blood pressure if: °· Your blood pressure cannot be lowered with healthy choices. °· Your blood pressure is higher than 130/80. °Follow these instructions at home: °Eating and drinking  °· If directed, follow the DASH eating plan. This diet includes: °¨ Filling half of your plate at each meal with fruits and vegetables. °¨ Filling one quarter of your plate at each meal with whole grains. Whole grains include whole wheat pasta, brown rice, and whole grain bread. °¨ Eating or drinking low-fat dairy products, such as skim milk or low-fat yogurt. °¨ Filling one quarter of your plate at each meal with low-fat (lean) proteins. Low-fat proteins include fish, skinless chicken, eggs, beans, and tofu. °¨ Avoiding fatty meat, cured and processed meat, or chicken with skin. °¨ Avoiding premade or processed food. °· Eat less than 1,500 mg of salt (sodium) a day. °· Limit alcohol use to no more than 1 drink a day for nonpregnant women and 2 drinks a day for men. One drink equals 12 oz of beer, 5 oz of wine, or 1½ oz of hard liquor. °Lifestyle  °· Work with your doctor to stay at a healthy weight or to lose weight. Ask your doctor what the best weight is for you. °· Get at least 30 minutes of exercise that causes your heart to beat faster (aerobic exercise) most days of the week. This may include walking, swimming, or biking. °· Get at least 30 minutes of exercise that strengthens your muscles (resistance exercise) at least 3 days a week. This may include lifting weights or pilates. °· Do not use any  products that contain nicotine or tobacco. This includes cigarettes and e-cigarettes. If you need help quitting, ask your doctor. °· Check your blood pressure at home as told by your doctor. °· Keep all follow-up visits as told by your doctor. This is important. °Medicines  °· Take over-the-counter and prescription medicines only as told by your doctor. Follow directions carefully. °· Do not skip doses of blood pressure medicine. The medicine does not work as well if you skip doses. Skipping doses also puts you at risk for problems. °· Ask your doctor about side effects or reactions to medicines that you should watch for. °Contact a doctor if: °· You think you are having a reaction to the medicine you are taking. °· You have headaches that keep coming back (recurring). °· You feel dizzy. °· You have swelling in your ankles. °· You have trouble with your vision. °Get help right away if: °· You get a very bad headache. °· You start to feel confused. °· You feel weak or numb. °· You feel faint. °· You get very bad pain in your: °¨ Chest. °¨ Belly (abdomen). °· You throw up (vomit) more than once. °· You have trouble breathing. °Summary °· Hypertension is another name for high blood pressure. °· Making healthy choices can help lower blood pressure. If your blood pressure cannot be controlled with healthy choices, you may need to take medicine. °This information is not intended to replace advice given to you by your   health care provider. Make sure you discuss any questions you have with your health care provider. °Document Released: 04/19/2008 Document Revised: 09/29/2016 Document Reviewed: 09/29/2016 °Elsevier Interactive Patient Education © 2017 Elsevier Inc. ° °

## 2017-01-27 NOTE — Progress Notes (Signed)
Subjective:    Patient ID: Charles Smith, male    DOB: [birth year], 66 y.o.   MRN: [id number]  HPI  Pt presents to the clinic today for follow up of HTN. His BP at her last visit was 164/100. His Losartan was increased to 50 mg daily. He has been taking the medication as prescribed. He feels like his bowels have been looser since starting this higher dose. His BP today is 134/84. There is no ECG on file.  Review of Systems      Past Medical History:  Diagnosis Date  . History of chicken pox     Current Outpatient Prescriptions  Medication Sig Dispense Refill  . losartan (COZAAR) 50 MG tablet Take 1 tablet (50 mg total) by mouth daily. 30 tablet 0  . Multiple Vitamins-Minerals (CENTRUM ADULTS PO) Take 1 capsule by mouth daily.    . Omega-3 Fatty Acids (FISH OIL) 1000 MG CAPS Take 1,000 mg by mouth 2 (two) times daily.    . sildenafil (REVATIO) 20 MG tablet Take 2-5 tablets as needed for sexual encounter. MUST SCHEDULE ANNUAL EXAM 50 tablet 0  . simvastatin (ZOCOR) 10 MG tablet Take 1 tablet (10 mg total) by mouth at bedtime. 30 tablet 2   No current facility-administered medications for this visit.     No Known Allergies  Family History  Problem Relation Age of Onset  . Hyperlipidemia Sister   . Heart disease Brother     heart transplant  . Kidney cancer Brother   . Depression Neg Hx   . Stroke Neg Hx     Social History   Social History  . Marital status: Divorced    Spouse name: N/A  . Number of children: N/A  . Years of education: N/A   Occupational History  . Not on file.   Social History Main Topics  . Smoking status: Former Smoker    Types: Cigarettes  . Smokeless tobacco: Never Used  . Alcohol use 0.0 oz/week     Comment: occasional  . Drug use: No  . Sexual activity: Yes   Other Topics Concern  . Not on file   Social History Narrative  . No narrative on file     Constitutional: Denies fever, malaise, fatigue, headache or abrupt weight  changes.  Respiratory: Denies difficulty breathing, shortness of breath, cough or sputum production.   Cardiovascular: Denies chest pain, chest tightness, palpitations or swelling in the hands or feet.  Neurological: Denies dizziness, difficulty with memory, difficulty with speech or problems with balance and coordination.    No other specific complaints in a complete review of systems (except as listed in HPI above).  Objective:   Physical Exam  BP 134/84   Pulse 86   Temp 97.5 F (36.4 C) (Oral)   Wt 186 lb (84.4 kg)   SpO2 97%   BMI 29.35 kg/m  Wt Readings from Last 3 Encounters:  01/27/17 186 lb (84.4 kg)  01/03/17 182 lb (82.6 kg)  12/10/16 178 lb (80.7 kg)    General: Appears her stated age, well developed, well nourished in NAD. Cardiovascular: Normal rate and rhythm.  Pulmonary/Chest: Normal effort and positive vesicular breath sounds. No respiratory distress. No wheezes, rales or ronchi noted.  Musculoskeletal: Normal range of motion. No signs of joint swelling. No difficulty with gait.  Neurological: Alert and oriented.   BMET    Component Value Date/Time   NA 141 12/10/2016 1517   K 5.0 12/10/2016 1517  CL 106 12/10/2016 1517   CO2 28 12/10/2016 1517   GLUCOSE 96 12/10/2016 1517   BUN 22 12/10/2016 1517   CREATININE 0.98 12/10/2016 1517   CALCIUM 9.9 12/10/2016 1517    Lipid Panel     Component Value Date/Time   CHOL 289 (H) 12/10/2016 1517   TRIG 395.0 (H) 12/10/2016 1517   HDL 43.20 12/10/2016 1517   CHOLHDL 7 12/10/2016 1517   VLDL 79.0 (H) 12/10/2016 1517    CBC    Component Value Date/Time   WBC 10.3 12/10/2016 1517   RBC 4.56 12/10/2016 1517   HGB 14.1 12/10/2016 1517   HCT 40.8 12/10/2016 1517   PLT 280.0 12/10/2016 1517   MCV 89.4 12/10/2016 1517   MCHC 34.5 12/10/2016 1517   RDW 14.4 12/10/2016 1517    Hgb A1C No results found for: HGBA1C          Assessment & Plan:

## 2017-01-28 ENCOUNTER — Ambulatory Visit: Payer: Medicare HMO | Admitting: Internal Medicine

## 2017-02-18 ENCOUNTER — Ambulatory Visit (INDEPENDENT_AMBULATORY_CARE_PROVIDER_SITE_OTHER): Payer: Medicare HMO | Admitting: Internal Medicine

## 2017-02-18 ENCOUNTER — Encounter: Payer: Self-pay | Admitting: Internal Medicine

## 2017-02-18 DIAGNOSIS — I10 Essential (primary) hypertension: Secondary | ICD-10-CM

## 2017-02-18 MED ORDER — LOSARTAN POTASSIUM 100 MG PO TABS: 100.0000 mg | ORAL_TABLET | Freq: Every day | ORAL | 2 refills | Status: DC

## 2017-02-18 NOTE — Assessment & Plan Note (Signed)
Still not at goal Encouraged exercise Increase Losartan to 100 mg daily  RTC in 3 weeks for nurse visit for BP check

## 2017-02-18 NOTE — Patient Instructions (Signed)
Hypertension °Hypertension is another name for high blood pressure. High blood pressure forces your heart to work harder to pump blood. This can cause problems over time. °There are two numbers in a blood pressure reading. There is a top number (systolic) over a bottom number (diastolic). It is best to have a blood pressure below 120/80. Healthy choices can help lower your blood pressure. You may need medicine to help lower your blood pressure if: °· Your blood pressure cannot be lowered with healthy choices. °· Your blood pressure is higher than 130/80. °Follow these instructions at home: °Eating and drinking  °· If directed, follow the DASH eating plan. This diet includes: °¨ Filling half of your plate at each meal with fruits and vegetables. °¨ Filling one quarter of your plate at each meal with whole grains. Whole grains include whole wheat pasta, brown rice, and whole grain bread. °¨ Eating or drinking low-fat dairy products, such as skim milk or low-fat yogurt. °¨ Filling one quarter of your plate at each meal with low-fat (lean) proteins. Low-fat proteins include fish, skinless chicken, eggs, beans, and tofu. °¨ Avoiding fatty meat, cured and processed meat, or chicken with skin. °¨ Avoiding premade or processed food. °· Eat less than 1,500 mg of salt (sodium) a day. °· Limit alcohol use to no more than 1 drink a day for nonpregnant women and 2 drinks a day for men. One drink equals 12 oz of beer, 5 oz of wine, or 1½ oz of hard liquor. °Lifestyle  °· Work with your doctor to stay at a healthy weight or to lose weight. Ask your doctor what the best weight is for you. °· Get at least 30 minutes of exercise that causes your heart to beat faster (aerobic exercise) most days of the week. This may include walking, swimming, or biking. °· Get at least 30 minutes of exercise that strengthens your muscles (resistance exercise) at least 3 days a week. This may include lifting weights or pilates. °· Do not use any  products that contain nicotine or tobacco. This includes cigarettes and e-cigarettes. If you need help quitting, ask your doctor. °· Check your blood pressure at home as told by your doctor. °· Keep all follow-up visits as told by your doctor. This is important. °Medicines  °· Take over-the-counter and prescription medicines only as told by your doctor. Follow directions carefully. °· Do not skip doses of blood pressure medicine. The medicine does not work as well if you skip doses. Skipping doses also puts you at risk for problems. °· Ask your doctor about side effects or reactions to medicines that you should watch for. °Contact a doctor if: °· You think you are having a reaction to the medicine you are taking. °· You have headaches that keep coming back (recurring). °· You feel dizzy. °· You have swelling in your ankles. °· You have trouble with your vision. °Get help right away if: °· You get a very bad headache. °· You start to feel confused. °· You feel weak or numb. °· You feel faint. °· You get very bad pain in your: °¨ Chest. °¨ Belly (abdomen). °· You throw up (vomit) more than once. °· You have trouble breathing. °Summary °· Hypertension is another name for high blood pressure. °· Making healthy choices can help lower blood pressure. If your blood pressure cannot be controlled with healthy choices, you may need to take medicine. °This information is not intended to replace advice given to you by your   health care provider. Make sure you discuss any questions you have with your health care provider. °Document Released: 04/19/2008 Document Revised: 09/29/2016 Document Reviewed: 09/29/2016 °Elsevier Interactive Patient Education © 2017 Elsevier Inc. ° °

## 2017-02-18 NOTE — Progress Notes (Signed)
Subjective:    Patient ID: Charles Smith, male    DOB: 17-Mar-1951, 66 y.o.   MRN: [id number]  HPI  Pt presents to the clinic today to follow up HTN. At his last visit, his Losartan was increased to 75 mg daily. He has been taking the medication as prescribed. His BP today is 142/70.   Review of Systems  Past Medical History:  Diagnosis Date  . History of chicken pox     Current Outpatient Prescriptions  Medication Sig Dispense Refill  . losartan (COZAAR) 25 MG tablet Take 1 tablet (25 mg total) by mouth daily. Total of 75 mg daily. 30 tablet 0  . losartan (COZAAR) 50 MG tablet Take 1 tablet (50 mg total) by mouth daily. Total of 75 mg daily 30 tablet 0  . Multiple Vitamins-Minerals (CENTRUM ADULTS PO) Take 1 capsule by mouth daily.    . Omega-3 Fatty Acids (FISH OIL) 1000 MG CAPS Take 1,000 mg by mouth 2 (two) times daily.    . sildenafil (REVATIO) 20 MG tablet Take 2-5 tablets as needed for sexual encounter. MUST SCHEDULE ANNUAL EXAM 50 tablet 0  . simvastatin (ZOCOR) 10 MG tablet Take 1 tablet (10 mg total) by mouth at bedtime. 30 tablet 2   No current facility-administered medications for this visit.     No Known Allergies  Family History  Problem Relation Age of Onset  . Hyperlipidemia Sister   . Heart disease Brother     heart transplant  . Kidney cancer Brother   . Depression Neg Hx   . Stroke Neg Hx     Social History   Social History  . Marital status: Divorced    Spouse name: N/A  . Number of children: N/A  . Years of education: N/A   Occupational History  . Not on file.   Social History Main Topics  . Smoking status: Former Smoker    Types: Cigarettes  . Smokeless tobacco: Never Used  . Alcohol use 0.0 oz/week     Comment: occasional  . Drug use: No  . Sexual activity: Yes   Other Topics Concern  . Not on file   Social History Narrative  . No narrative on file     Constitutional: Denies fever, malaise, fatigue, headache or abrupt  weight changes.  Respiratory: Denies difficulty breathing, shortness of breath, cough or sputum production.   Cardiovascular: Denies chest pain, chest tightness, palpitations or swelling in the hands or feet.    No other specific complaints in a complete review of systems (except as listed in HPI above).     Objective:   Physical Exam   BP (!) 142/70   Pulse 97   Temp 97.8 F (36.6 C) (Oral)   Wt 187 lb 12 oz (85.2 kg)   SpO2 95%   BMI 29.63 kg/m  Wt Readings from Last 3 Encounters:  02/18/17 187 lb 12 oz (85.2 kg)  01/27/17 186 lb (84.4 kg)  01/03/17 182 lb (82.6 kg)    General: Appears his stated age, well developed, well nourished in NAD. Cardiovascular: Normal rate and rhythm. S1,S2 noted.  No murmur, rubs or gallops noted.  Pulmonary/Chest: Normal effort and positive vesicular breath sounds. No respiratory distress. No wheezes, rales or ronchi noted.  Neurological: Alert and oriented.   BMET    Component Value Date/Time   NA 141 12/10/2016 1517   K 5.0 12/10/2016 1517   CL 106 12/10/2016 1517   CO2 28 12/10/2016 1517  GLUCOSE 96 12/10/2016 1517   BUN 22 12/10/2016 1517   CREATININE 0.98 12/10/2016 1517   CALCIUM 9.9 12/10/2016 1517    Lipid Panel     Component Value Date/Time   CHOL 289 (H) 12/10/2016 1517   TRIG 395.0 (H) 12/10/2016 1517   HDL 43.20 12/10/2016 1517   CHOLHDL 7 12/10/2016 1517   VLDL 79.0 (H) 12/10/2016 1517    CBC    Component Value Date/Time   WBC 10.3 12/10/2016 1517   RBC 4.56 12/10/2016 1517   HGB 14.1 12/10/2016 1517   HCT 40.8 12/10/2016 1517   PLT 280.0 12/10/2016 1517   MCV 89.4 12/10/2016 1517   MCHC 34.5 12/10/2016 1517   RDW 14.4 12/10/2016 1517    Hgb A1C No results found for: HGBA1C         Assessment & Plan:

## 2017-02-18 NOTE — Progress Notes (Signed)
Pre visit review using our clinic review tool, if applicable. No additional management support is needed unless otherwise documented below in the visit note. 

## 2017-02-25 ENCOUNTER — Ambulatory Visit: Payer: Medicare HMO | Admitting: Internal Medicine

## 2017-03-04 ENCOUNTER — Encounter: Payer: Self-pay | Admitting: Internal Medicine

## 2017-04-08 ENCOUNTER — Other Ambulatory Visit (INDEPENDENT_AMBULATORY_CARE_PROVIDER_SITE_OTHER): Payer: Medicare HMO

## 2017-04-08 DIAGNOSIS — E7800 Pure hypercholesterolemia, unspecified: Secondary | ICD-10-CM | POA: Diagnosis not present

## 2017-04-08 NOTE — Addendum Note (Signed)
Addended by: Marchia Bond on: 04/08/2017 04:04 PM   Modules accepted: Orders

## 2017-04-09 ENCOUNTER — Other Ambulatory Visit: Payer: Self-pay | Admitting: Internal Medicine

## 2017-04-09 LAB — COMPREHENSIVE METABOLIC PANEL
ALBUMIN: 4.2 g/dL (ref 3.6–5.1)
ALK PHOS: 61 U/L (ref 40–115)
ALT: 33 U/L (ref 9–46)
AST: 28 U/L (ref 10–35)
BILIRUBIN TOTAL: 0.7 mg/dL (ref 0.2–1.2)
BUN: 22 mg/dL (ref 7–25)
CALCIUM: 9.5 mg/dL (ref 8.6–10.3)
CO2: 21 mmol/L (ref 20–31)
Chloride: 104 mmol/L (ref 98–110)
Creat: 1.14 mg/dL (ref 0.70–1.25)
Glucose, Bld: 108 mg/dL — ABNORMAL HIGH (ref 65–99)
Potassium: 4 mmol/L (ref 3.5–5.3)
Sodium: 137 mmol/L (ref 135–146)
TOTAL PROTEIN: 6.9 g/dL (ref 6.1–8.1)

## 2017-04-09 LAB — LIPID PANEL
Cholesterol: 187 mg/dL (ref <200)
HDL: 35 mg/dL — AB (ref >40)
TRIGLYCERIDES: 469 mg/dL — AB (ref <150)
Total CHOL/HDL Ratio: 5.3 Ratio — ABNORMAL HIGH (ref <5.0)

## 2017-04-12 MED ORDER — FENOFIBRATE 48 MG PO TABS: 48.0000 mg | ORAL_TABLET | Freq: Every day | ORAL | 2 refills | Status: DC

## 2017-05-12 ENCOUNTER — Encounter: Payer: Self-pay | Admitting: Internal Medicine

## 2017-05-12 ENCOUNTER — Telehealth: Payer: Self-pay

## 2017-05-12 MED ORDER — FENOFIBRATE 54 MG PO TABS: 54.0000 mg | ORAL_TABLET | Freq: Every day | ORAL | 2 refills | Status: DC

## 2017-05-12 NOTE — Addendum Note (Signed)
Addended by: Lurlean Nanny on: 05/12/2017 09:47 AM   Modules accepted: Orders

## 2017-05-12 NOTE — Telephone Encounter (Signed)
That is fine to send in Fenofibrate 54 mg # 30, 2 refills

## 2017-05-12 NOTE — Telephone Encounter (Signed)
New Rx sent to pharmacy

## 2017-05-12 NOTE — Telephone Encounter (Signed)
Charles Smith with [facility] left [facility] can no longer get fenofibrate 48 mg(no longer manufacturing) and Charles Smith wants to know if could get new rx with fenofibrate 54 mg.Please advise.

## 2017-05-27 ENCOUNTER — Other Ambulatory Visit: Payer: Self-pay | Admitting: Internal Medicine

## 2017-07-08 ENCOUNTER — Other Ambulatory Visit: Payer: Self-pay | Admitting: Internal Medicine

## 2017-07-08 DIAGNOSIS — E782 Mixed hyperlipidemia: Secondary | ICD-10-CM

## 2017-07-08 DIAGNOSIS — H5213 Myopia, bilateral: Secondary | ICD-10-CM | POA: Diagnosis not present

## 2017-08-11 ENCOUNTER — Other Ambulatory Visit: Payer: Self-pay | Admitting: Internal Medicine

## 2017-08-11 DIAGNOSIS — E782 Mixed hyperlipidemia: Secondary | ICD-10-CM

## 2017-09-22 ENCOUNTER — Other Ambulatory Visit: Payer: Self-pay | Admitting: Internal Medicine

## 2017-10-14 ENCOUNTER — Other Ambulatory Visit: Payer: Self-pay | Admitting: Internal Medicine

## 2017-10-21 ENCOUNTER — Other Ambulatory Visit (INDEPENDENT_AMBULATORY_CARE_PROVIDER_SITE_OTHER): Payer: Medicare HMO

## 2017-10-21 DIAGNOSIS — E782 Mixed hyperlipidemia: Secondary | ICD-10-CM | POA: Diagnosis not present

## 2017-10-21 LAB — LIPID PANEL
CHOLESTEROL: 207 mg/dL — AB (ref <200)
HDL: 36 mg/dL — ABNORMAL LOW (ref >40)
Non-HDL Cholesterol (Calc): 171 mg/dL (calc) — ABNORMAL HIGH (ref <130)
Total CHOL/HDL Ratio: 5.8 (calc) — ABNORMAL HIGH (ref <5.0)
Triglycerides: 420 mg/dL — ABNORMAL HIGH (ref <150)

## 2017-11-01 ENCOUNTER — Encounter: Payer: Self-pay | Admitting: *Deleted

## 2017-11-01 ENCOUNTER — Other Ambulatory Visit: Payer: Self-pay | Admitting: *Deleted

## 2017-11-17 ENCOUNTER — Other Ambulatory Visit: Payer: Self-pay | Admitting: Internal Medicine

## 2017-11-17 DIAGNOSIS — E782 Mixed hyperlipidemia: Secondary | ICD-10-CM

## 2017-12-15 ENCOUNTER — Other Ambulatory Visit: Payer: Self-pay | Admitting: Internal Medicine

## 2017-12-21 ENCOUNTER — Other Ambulatory Visit: Payer: Self-pay

## 2017-12-21 DIAGNOSIS — E782 Mixed hyperlipidemia: Secondary | ICD-10-CM

## 2017-12-21 MED ORDER — FENOFIBRATE 54 MG PO TABS: ORAL_TABLET | ORAL | 0 refills | Status: DC

## 2017-12-21 MED ORDER — SIMVASTATIN 10 MG PO TABS: 10.0000 mg | ORAL_TABLET | Freq: Every day | ORAL | 0 refills | Status: DC

## 2017-12-21 MED ORDER — LOSARTAN POTASSIUM 100 MG PO TABS: 100.0000 mg | ORAL_TABLET | Freq: Every day | ORAL | 0 refills | Status: DC

## 2017-12-21 NOTE — Addendum Note (Signed)
Addended by: Lurlean Nanny on: 12/21/2017 10:48 AM   Modules accepted: Orders

## 2018-01-02 ENCOUNTER — Encounter: Payer: Self-pay | Admitting: Internal Medicine

## 2018-01-02 ENCOUNTER — Ambulatory Visit (INDEPENDENT_AMBULATORY_CARE_PROVIDER_SITE_OTHER): Payer: Medicare HMO | Admitting: Internal Medicine

## 2018-01-02 VITALS — BP 140/84 | HR 90 | Temp 97.9°F | Ht 67.0 in | Wt 189.0 lb

## 2018-01-02 DIAGNOSIS — E782 Mixed hyperlipidemia: Secondary | ICD-10-CM | POA: Diagnosis not present

## 2018-01-02 DIAGNOSIS — Z23 Encounter for immunization: Secondary | ICD-10-CM

## 2018-01-02 DIAGNOSIS — N529 Male erectile dysfunction, unspecified: Secondary | ICD-10-CM

## 2018-01-02 DIAGNOSIS — Z125 Encounter for screening for malignant neoplasm of prostate: Secondary | ICD-10-CM | POA: Diagnosis not present

## 2018-01-02 DIAGNOSIS — I10 Essential (primary) hypertension: Secondary | ICD-10-CM

## 2018-01-02 DIAGNOSIS — J449 Chronic obstructive pulmonary disease, unspecified: Secondary | ICD-10-CM

## 2018-01-02 DIAGNOSIS — E1169 Type 2 diabetes mellitus with other specified complication: Secondary | ICD-10-CM | POA: Insufficient documentation

## 2018-01-02 DIAGNOSIS — Z0000 Encounter for general adult medical examination without abnormal findings: Secondary | ICD-10-CM | POA: Diagnosis not present

## 2018-01-02 NOTE — Progress Notes (Signed)
HPI:  Pt presents to the clinic today for his Medicare Wellness Exam. He is also due to follow up chronic conditions.  HTN: His BP today is 140/84, but he reports it was 123/70 before he came. He is taking Losartan as prescribed. There is no ECG on file.  HLD: His last triglycerides were 420, 10/2017. He is taking Simvastatin and Fenofibrate as prescribed. He tries to consume a low fat diet.  ED: He takes Revatio as needed.  COPD: He denies cough or shortness of breath.  Past Medical History:  Diagnosis Date  . History of chicken pox     Current Outpatient Medications  Medication Sig Dispense Refill  . fenofibrate 54 MG tablet TAKE 1 TABLET BY MOUTH ONCE A DAY 90 tablet 0  . losartan (COZAAR) 100 MG tablet Take 1 tablet (100 mg total) by mouth daily. 90 tablet 0  . Multiple Vitamins-Minerals (CENTRUM ADULTS PO) Take 1 capsule by mouth daily.    . Omega-3 Fatty Acids (FISH OIL) 1000 MG CAPS Take 1,000 mg by mouth 2 (two) times daily.    . sildenafil (REVATIO) 20 MG tablet Take 2-5 tablets as needed for sexual encounter. MUST SCHEDULE ANNUAL EXAM 50 tablet 0  . simvastatin (ZOCOR) 10 MG tablet Take 1 tablet (10 mg total) by mouth daily at 6 PM. 90 tablet 0   No current facility-administered medications for this visit.     No Known Allergies  Family History  Problem Relation Age of Onset  . Hyperlipidemia Sister   . Heart disease Brother        heart transplant  . Kidney cancer Brother   . Depression Neg Hx   . Stroke Neg Hx     Social History   Socioeconomic History  . Marital status: Divorced    Spouse name: Not on file  . Number of children: Not on file  . Years of education: Not on file  . Highest education level: Not on file  Social Needs  . Financial resource strain: Not on file  . Food insecurity - worry: Not on file  . Food insecurity - inability: Not on file  . Transportation needs - medical: Not on file  . Transportation needs - non-medical: Not on file   Occupational History  . Not on file  Tobacco Use  . Smoking status: Former Smoker    Types: Cigarettes  . Smokeless tobacco: Never Used  Substance and Sexual Activity  . Alcohol use: Yes    Alcohol/week: 0.0 oz    Comment: occasional  . Drug use: No  . Sexual activity: Yes  Other Topics Concern  . Not on file  Social History Narrative  . Not on file    Hospitiliaztions: None  Health Maintenance:    Flu: 11/2016  Tetanus: 04/2015  Pneumovax: 11/2016  Prevnar: 04/2015  Zostavax: never  PSA: 11/2016  Colon Screening: 12/2016  Eye Doctor: annually  Dental Exam: biannually   Providers:   PCP: Webb Silversmith, NP-C    I have personally reviewed and have noted:  1. The patient's medical and social history 2. Their use of alcohol, tobacco or illicit drugs 3. Their current medications and supplements 4. The patient's functional ability including ADL's, fall risks, home safety risks and hearing or visual impairment. 5. Diet and physical activities 6. Evidence for depression or mood disorder  Subjective:   Review of Systems:   Constitutional: Denies fever, malaise, fatigue, headache or abrupt weight changes.  HEENT: Denies eye pain, eye  redness, ear pain, ringing in the ears, wax buildup, runny nose, nasal congestion, bloody nose, or sore throat. Respiratory: Denies difficulty breathing, shortness of breath, cough or sputum production.   Cardiovascular: Denies chest pain, chest tightness, palpitations or swelling in the hands or feet.  Gastrointestinal: Denies abdominal pain, bloating, constipation, diarrhea or blood in the stool.  GU: Pt reports erectile dysfunction. Denies urgency, frequency, pain with urination, burning sensation, blood in urine, odor or discharge. Musculoskeletal: Denies decrease in range of motion, difficulty with gait, muscle pain or joint pain and swelling.  Skin: Denies redness, rashes, lesions or ulcercations.  Neurological: Denies dizziness,  difficulty with memory, difficulty with speech or problems with balance and coordination.  Psych: Denies anxiety, depression, SI/HI.  No other specific complaints in a complete review of systems (except as listed in HPI above).  Objective:  PE:   BP 140/84   Pulse 90   Temp 97.9 F (36.6 C) (Oral)   Ht 5\' 7"  (1.702 m)   Wt 189 lb (85.7 kg)   SpO2 97%   BMI 29.60 kg/m   Wt Readings from Last 3 Encounters:  02/18/17 187 lb 12 oz (85.2 kg)  01/27/17 186 lb (84.4 kg)  01/03/17 182 lb (82.6 kg)    General: Appears his stated age, obese in NAD. Skin: Warm, dry and intact. N HEENT: Head: normal shape and size; Eyes: sclera white, no icterus, conjunctiva pink, PERRLA and EOMs intact; Ears: Tm's gray and intact, normal light reflex; Throat/Mouth: Teeth present, mucosa pink and moist, no exudate, lesions or ulcerations noted.  Neck: Neck supple, trachea midline. No masses, lumps or thyromegaly present.  Cardiovascular: Normal rate and rhythm. S1,S2 noted.  No murmur, rubs or gallops noted. No JVD or BLE edema. No carotid bruits noted. Pulmonary/Chest: Normal effort and diminished vesicular breath sounds. No respiratory distress. No wheezes, rales or ronchi noted.  Abdomen: Soft and nontender. Normal bowel sounds. Ventral hernia noted. Liver, spleen and kidneys non palpable. Musculoskeletal: Strength 5/5 BUE/BLE. No signs of joint swelling.  Neurological: Alert and oriented. Cranial nerves II-XII grossly intact. Coordination normal.  Psychiatric: Mood and affect normal. Behavior is normal. Judgment and thought content normal.     BMET    Component Value Date/Time   NA 137 04/08/2017 1604   K 4.0 04/08/2017 1604   CL 104 04/08/2017 1604   CO2 21 04/08/2017 1604   GLUCOSE 108 (H) 04/08/2017 1604   BUN 22 04/08/2017 1604   CREATININE 1.14 04/08/2017 1604   CALCIUM 9.5 04/08/2017 1604    Lipid Panel     Component Value Date/Time   CHOL 207 (H) 10/21/2017 1614   TRIG 420 (H)  10/21/2017 1614   HDL 36 (L) 10/21/2017 1614   CHOLHDL 5.8 (H) 10/21/2017 1614   VLDL NOT CALC 04/08/2017 1604   [place] NOT CALC 04/08/2017 1604    CBC    Component Value Date/Time   WBC 10.3 12/10/2016 1517   RBC 4.56 12/10/2016 1517   HGB 14.1 12/10/2016 1517   HCT 40.8 12/10/2016 1517   PLT 280.0 12/10/2016 1517   MCV 89.4 12/10/2016 1517   MCHC 34.5 12/10/2016 1517   RDW 14.4 12/10/2016 1517    Hgb A1C No results found for: HGBA1C    Assessment and Plan:   Medicare Annual Wellness Visit:  Diet: He does eat meat. He consumes some fruits and veggies. He tries to avoid fried foods. He drinks mostly water, some unsweet tea. Physical activity: None Depression/mood screen: Negative  Hearing: Intact to whispered voice Visual acuity: Grossly normal, performs annual eye exam  ADLs: Capable Fall risk: None Home safety: Good Cognitive evaluation: Intact to orientation, naming, recall and repetition EOL planning: Adv directives, full code/ I agree  Preventative Medicine: Flu shot today. Tetanus, prevnar and pneumovax UTD. He declines zostovax or shingrix. Colon screening UTD. Encouraged him to consume a balanced diet and exercise regimen. Advised him to see an eye doctor and dentist annually. Will check CBC, CMET, Lipid and PSA today.   Next appointment: 1 year, Medicare Wellness Exam   Webb Silversmith, NP

## 2018-01-02 NOTE — Addendum Note (Signed)
Addended by: Lurlean Nanny on: 01/02/2018 04:19 PM   Modules accepted: Orders

## 2018-01-02 NOTE — Patient Instructions (Signed)

## 2018-01-02 NOTE — Assessment & Plan Note (Signed)
Elevated today, ? Anxiety Recheck CBC and CMET today Continue Losartan for now Reinforced DASH diet and exercise for weight loss

## 2018-01-02 NOTE — Assessment & Plan Note (Signed)
Continue Revatio prn 

## 2018-01-02 NOTE — Assessment & Plan Note (Signed)
Stable No issues off meds Will monitor

## 2018-01-02 NOTE — Assessment & Plan Note (Signed)
CMET and Lipid profile today °Encouraged him to consume a low fat diet °

## 2018-01-03 LAB — COMPREHENSIVE METABOLIC PANEL
ALT: 28 U/L (ref 0–53)
AST: 22 U/L (ref 0–37)
Albumin: 4.3 g/dL (ref 3.5–5.2)
Alkaline Phosphatase: 55 U/L (ref 39–117)
BILIRUBIN TOTAL: 0.5 mg/dL (ref 0.2–1.2)
BUN: 27 mg/dL — ABNORMAL HIGH (ref 6–23)
CALCIUM: 9.7 mg/dL (ref 8.4–10.5)
CO2: 29 meq/L (ref 19–32)
CREATININE: 1.4 mg/dL (ref 0.40–1.50)
Chloride: 104 mEq/L (ref 96–112)
GFR: 53.61 mL/min — AB (ref >60.00)
Glucose, Bld: 96 mg/dL (ref 70–99)
Potassium: 4.8 mEq/L (ref 3.5–5.1)
Sodium: 140 mEq/L (ref 135–145)
Total Protein: 7.2 g/dL (ref 6.0–8.3)

## 2018-01-03 LAB — CBC
HCT: 41.3 % (ref 39.0–52.0)
HEMOGLOBIN: 14.1 g/dL (ref 13.0–17.0)
MCHC: 34.1 g/dL (ref 30.0–36.0)
MCV: 89.5 fl (ref 78.0–100.0)
PLATELETS: 335 10*3/uL (ref 150.0–400.0)
RBC: 4.62 Mil/uL (ref 4.22–5.81)
RDW: 13.3 % (ref 11.5–15.5)
WBC: 9.9 10*3/uL (ref 4.0–10.5)

## 2018-01-03 LAB — PSA, MEDICARE: PSA: 0.96 ng/ml (ref 0.10–4.00)

## 2018-01-03 LAB — LIPID PANEL
Cholesterol: 156 mg/dL (ref 0–200)
HDL: 30 mg/dL — ABNORMAL LOW (ref >39.00)
Total CHOL/HDL Ratio: 5

## 2018-01-03 LAB — LDL CHOLESTEROL, DIRECT: LDL DIRECT: 83 mg/dL

## 2018-01-06 ENCOUNTER — Telehealth: Payer: Self-pay | Admitting: Internal Medicine

## 2018-01-06 NOTE — Telephone Encounter (Signed)
Reviewed results and physician's note with patient. He reports he is taking Fenofibrate as ordered. He stated that while he wasn't feeling well last week, he did take ibuprofen for several days. Discussed avoiding advil and increasing his water intake. Could not chart on result note as it was not forwarded to [facility].

## 2018-01-13 ENCOUNTER — Other Ambulatory Visit: Payer: Self-pay | Admitting: Internal Medicine

## 2018-01-13 DIAGNOSIS — E782 Mixed hyperlipidemia: Secondary | ICD-10-CM

## 2018-01-13 MED ORDER — FENOFIBRATE 120 MG PO TABS: 1.0000 | ORAL_TABLET | Freq: Every day | ORAL | 3 refills | Status: DC

## 2018-01-20 ENCOUNTER — Encounter: Payer: Self-pay | Admitting: Internal Medicine

## 2018-01-20 DIAGNOSIS — E782 Mixed hyperlipidemia: Secondary | ICD-10-CM

## 2018-01-23 MED ORDER — FENOFIBRATE 54 MG PO TABS: 108.0000 mg | ORAL_TABLET | Freq: Every day | ORAL | 1 refills | Status: DC

## 2018-01-23 NOTE — Telephone Encounter (Signed)
Yes, just take 2 of the 54 mg tabs

## 2018-01-23 NOTE — Addendum Note (Signed)
Addended by: Lurlean Nanny on: 01/23/2018 09:19 AM   Modules accepted: Orders

## 2018-01-23 NOTE — Telephone Encounter (Signed)
Please advise if okay to try 2 tabs of 54mg  tablet

## 2018-02-23 ENCOUNTER — Other Ambulatory Visit: Payer: Self-pay | Admitting: Internal Medicine

## 2018-02-23 DIAGNOSIS — E782 Mixed hyperlipidemia: Secondary | ICD-10-CM

## 2018-04-27 ENCOUNTER — Other Ambulatory Visit: Payer: Self-pay | Admitting: Internal Medicine

## 2018-04-27 DIAGNOSIS — E782 Mixed hyperlipidemia: Secondary | ICD-10-CM

## 2018-04-28 NOTE — Telephone Encounter (Signed)
Reminder letter for lab appt needed mailed

## 2018-05-16 ENCOUNTER — Other Ambulatory Visit (INDEPENDENT_AMBULATORY_CARE_PROVIDER_SITE_OTHER): Payer: Medicare HMO

## 2018-05-16 DIAGNOSIS — E782 Mixed hyperlipidemia: Secondary | ICD-10-CM | POA: Diagnosis not present

## 2018-05-16 LAB — LIPID PANEL
Cholesterol: 170 mg/dL (ref 0–200)
HDL: 35.6 mg/dL — ABNORMAL LOW (ref >39.00)
NONHDL: 134.38
Total CHOL/HDL Ratio: 5
Triglycerides: 257 mg/dL — ABNORMAL HIGH (ref 0.0–149.0)
VLDL: 51.4 mg/dL — AB (ref 0.0–40.0)

## 2018-05-16 LAB — LDL CHOLESTEROL, DIRECT: LDL DIRECT: 94 mg/dL

## 2018-07-04 ENCOUNTER — Other Ambulatory Visit: Payer: Self-pay | Admitting: Internal Medicine

## 2018-07-04 DIAGNOSIS — E782 Mixed hyperlipidemia: Secondary | ICD-10-CM

## 2019-02-10 ENCOUNTER — Other Ambulatory Visit: Payer: Self-pay | Admitting: Internal Medicine

## 2019-02-10 DIAGNOSIS — E782 Mixed hyperlipidemia: Secondary | ICD-10-CM

## 2019-02-13 ENCOUNTER — Other Ambulatory Visit: Payer: Self-pay

## 2019-02-13 ENCOUNTER — Encounter: Payer: Self-pay | Admitting: Internal Medicine

## 2019-02-13 ENCOUNTER — Ambulatory Visit (INDEPENDENT_AMBULATORY_CARE_PROVIDER_SITE_OTHER): Payer: Medicare HMO | Admitting: Internal Medicine

## 2019-02-13 DIAGNOSIS — I10 Essential (primary) hypertension: Secondary | ICD-10-CM

## 2019-02-13 DIAGNOSIS — N529 Male erectile dysfunction, unspecified: Secondary | ICD-10-CM

## 2019-02-13 DIAGNOSIS — R252 Cramp and spasm: Secondary | ICD-10-CM | POA: Diagnosis not present

## 2019-02-13 DIAGNOSIS — E782 Mixed hyperlipidemia: Secondary | ICD-10-CM

## 2019-02-13 NOTE — Assessment & Plan Note (Signed)
Continue Sildenafil as needed 

## 2019-02-13 NOTE — Patient Instructions (Signed)
Muscle Cramps and Spasms Muscle cramps and spasms are when muscles tighten by themselves. They usually get better within minutes. Muscle cramps are painful. They are usually stronger and last longer than muscle spasms. Muscle spasms may or may not be painful. They can last a few seconds or much longer. Cramps and spasms can affect any muscle, but they occur most often in the calf muscles of the leg. They are usually not caused by a serious problem. In many cases, the cause is not known. Some common causes include:  Doing more physical work or exercise than your body is ready for.  Using the muscles too much (overuse) by repeating certain movements too many times.  Staying in a certain position for a long time.  Playing a sport or doing an activity without preparing properly.  Using bad form or technique while playing a sport or doing an activity.  Not having enough water in your body (dehydration).  Injury.  Side effects of some medicines.  Low levels of the salts and minerals in your blood (electrolytes), such as low potassium or calcium. Follow these instructions at home: Managing pain and stiffness      Massage, stretch, and relax the muscle. Do this for many minutes at a time.  If told, put heat on tight or tense muscles as often as told by your doctor. Use the heat source that your doctor recommends, such as a moist heat pack or a heating pad. ? Place a towel between your skin and the heat source. ? Leave the heat on for 20-30 minutes. ? Remove the heat if your skin turns bright red. This is very important if you are not able to feel pain, heat, or cold. You may have a greater risk of getting burned.  If told, put ice on the affected area. This may help if you are sore or have pain after a cramp or spasm. ? Put ice in a plastic bag. ? Place a towel between your skin and the bag. ? Leave the ice on for 20 minutes, 2-3 times a day.  Try taking hot showers or baths to help  relax tight muscles. Eating and drinking  Drink enough fluid to keep your pee (urine) pale yellow.  Eat a healthy diet to help ensure that your muscles work well. This should include: ? Fruits and vegetables. ? Lean protein. ? Whole grains. ? Low-fat or nonfat dairy products. General instructions  If you are having cramps often, avoid intense exercise for several days.  Take over-the-counter and prescription medicines only as told by your doctor.  Watch for any changes in your symptoms.  Keep all follow-up visits as told by your doctor. This is important. Contact a doctor if:  Your cramps or spasms get worse or happen more often.  Your cramps or spasms do not get better with time. Summary  Muscle cramps and spasms are when muscles tighten by themselves. They usually get better within minutes.  Cramps and spasms occur most often in the calf muscles of the leg.  Massage, stretch, and relax the muscle. This may help the cramp or spasm go away.  Drink enough fluid to keep your pee (urine) pale yellow. This information is not intended to replace advice given to you by your health care provider. Make sure you discuss any questions you have with your health care provider. Document Released: 10/14/2008 Document Revised: 03/27/2018 Document Reviewed: 03/27/2018 Elsevier Interactive Patient Education  2019 [employer]vier Inc[employer]  

## 2019-02-13 NOTE — Progress Notes (Signed)
Virtual Visit via Video Note  I connected with Charles Smith on 02/13/19 at  2:15 PM EDT by a video enabled telemedicine application and verified that I am speaking with the correct person using two identifiers.   I discussed the limitations of evaluation and management by telemedicine and the availability of in person appointments. The patient expressed understanding and agreed to proceed.  History of Present Illness:  Pt due for follow up of chronic conditions.  HLD: Her last LDL was 94, triglycerides 257, 05/2018. He is taking Simvastatin and Fenofibrate as prescribed. He reports occasional cramping in legs and feet at night. He drinks 3 bottles of water per day.  He tries to consume a low fat diet.  HTN: He checks his BP at home. It usually runs 113/75- 135/79. He is taking Losartan as prescribed. He denies adverse side effects. There is no ECG on file.   ED: He takes Sildenafil as needed with good results.  Observations/Objective:  Alert and oriented x 3  NAD  Assessment and Plan:  See problem based charting  Follow Up Instructions:    I discussed the assessment and treatment plan with the patient. The patient was provided an opportunity to ask questions and all were answered. The patient agreed with the plan and demonstrated an understanding of the instructions.   The patient was advised to call back or seek an in-person evaluation if the symptoms worsen or if the condition fails to improve as anticipated.     Webb Silversmith, NP

## 2019-02-13 NOTE — Assessment & Plan Note (Addendum)
Controlled on Losartan Reinforced DASH diet and exercise for weight loss Will have him make a lab only appt for CMET Will also check Mg level given reports of muscle cramps

## 2019-02-13 NOTE — Assessment & Plan Note (Signed)
Will have him make lab only appt for CMET and Lipid profile Encouraged him to consume a low fat diet Continue Simvastatin, Fenofibrate and Fish Oil

## 2019-02-15 ENCOUNTER — Other Ambulatory Visit (INDEPENDENT_AMBULATORY_CARE_PROVIDER_SITE_OTHER): Payer: Medicare HMO

## 2019-02-15 ENCOUNTER — Other Ambulatory Visit: Payer: Self-pay

## 2019-02-15 DIAGNOSIS — E782 Mixed hyperlipidemia: Secondary | ICD-10-CM

## 2019-02-15 DIAGNOSIS — I10 Essential (primary) hypertension: Secondary | ICD-10-CM

## 2019-02-15 DIAGNOSIS — N529 Male erectile dysfunction, unspecified: Secondary | ICD-10-CM | POA: Diagnosis not present

## 2019-02-15 LAB — LIPID PANEL
Cholesterol: 180 mg/dL (ref 0–200)
HDL: 34.5 mg/dL — ABNORMAL LOW (ref >39.00)
NonHDL: 145.31
Total CHOL/HDL Ratio: 5
Triglycerides: 347 mg/dL — ABNORMAL HIGH (ref 0.0–149.0)
VLDL: 69.4 mg/dL — ABNORMAL HIGH (ref 0.0–40.0)

## 2019-02-15 LAB — COMPREHENSIVE METABOLIC PANEL
ALT: 32 U/L (ref 0–53)
AST: 29 U/L (ref 0–37)
Albumin: 4.5 g/dL (ref 3.5–5.2)
Alkaline Phosphatase: 52 U/L (ref 39–117)
BUN: 27 mg/dL — ABNORMAL HIGH (ref 6–23)
CO2: 25 mEq/L (ref 19–32)
Calcium: 9.7 mg/dL (ref 8.4–10.5)
Chloride: 103 mEq/L (ref 96–112)
Creatinine, Ser: 1.34 mg/dL (ref 0.40–1.50)
GFR: 52.88 mL/min — ABNORMAL LOW (ref >60.00)
Glucose, Bld: 110 mg/dL — ABNORMAL HIGH (ref 70–99)
Potassium: 4.2 mEq/L (ref 3.5–5.1)
Sodium: 138 mEq/L (ref 135–145)
Total Bilirubin: 0.6 mg/dL (ref 0.2–1.2)
Total Protein: 7.5 g/dL (ref 6.0–8.3)

## 2019-02-15 LAB — MAGNESIUM: Magnesium: 2 mg/dL (ref 1.5–2.5)

## 2019-02-15 LAB — LDL CHOLESTEROL, DIRECT: Direct LDL: 97 mg/dL

## 2019-02-16 ENCOUNTER — Encounter: Payer: Medicare HMO | Admitting: Internal Medicine

## 2019-02-21 MED ORDER — EZETIMIBE 10 MG PO TABS: 10.0000 mg | ORAL_TABLET | Freq: Every day | ORAL | 0 refills | Status: DC

## 2019-02-21 NOTE — Addendum Note (Signed)
Addended by: Lurlean Nanny on: 02/21/2019 03:09 PM   Modules accepted: Orders

## 2019-05-04 ENCOUNTER — Other Ambulatory Visit: Payer: Self-pay | Admitting: Internal Medicine

## 2019-06-08 ENCOUNTER — Encounter: Payer: Self-pay | Admitting: Internal Medicine

## 2019-06-08 ENCOUNTER — Ambulatory Visit (INDEPENDENT_AMBULATORY_CARE_PROVIDER_SITE_OTHER): Payer: Medicare HMO | Admitting: Internal Medicine

## 2019-06-08 ENCOUNTER — Other Ambulatory Visit: Payer: Self-pay

## 2019-06-08 VITALS — BP 140/90 | HR 97 | Temp 98.6°F | Ht 67.0 in | Wt 200.0 lb

## 2019-06-08 DIAGNOSIS — Z0000 Encounter for general adult medical examination without abnormal findings: Secondary | ICD-10-CM

## 2019-06-08 DIAGNOSIS — J410 Simple chronic bronchitis: Secondary | ICD-10-CM | POA: Diagnosis not present

## 2019-06-08 DIAGNOSIS — E782 Mixed hyperlipidemia: Secondary | ICD-10-CM

## 2019-06-08 DIAGNOSIS — Z125 Encounter for screening for malignant neoplasm of prostate: Secondary | ICD-10-CM

## 2019-06-08 DIAGNOSIS — E785 Hyperlipidemia, unspecified: Secondary | ICD-10-CM | POA: Diagnosis not present

## 2019-06-08 DIAGNOSIS — J449 Chronic obstructive pulmonary disease, unspecified: Secondary | ICD-10-CM | POA: Insufficient documentation

## 2019-06-08 DIAGNOSIS — I10 Essential (primary) hypertension: Secondary | ICD-10-CM | POA: Diagnosis not present

## 2019-06-08 DIAGNOSIS — N529 Male erectile dysfunction, unspecified: Secondary | ICD-10-CM | POA: Diagnosis not present

## 2019-06-08 NOTE — Patient Instructions (Signed)
Health Maintenance After Age 69 After age 69, you are at a higher risk for certain long-term diseases and infections as well as injuries from falls. Falls are a major cause of broken bones and head injuries in people who are older than age 69. Getting regular preventive care can help to keep you healthy and well. Preventive care includes getting regular testing and making lifestyle changes as recommended by your health care provider. Talk with your health care provider about:  Which screenings and tests you should have. A screening is a test that checks for a disease when you have no symptoms.  A diet and exercise plan that is right for you. What should I know about screenings and tests to prevent falls? Screening and testing are the best ways to find a health problem early. Early diagnosis and treatment give you the best chance of managing medical conditions that are common after age 69. Certain conditions and lifestyle choices may make you more likely to have a fall. Your health care provider may recommend:  Regular vision checks. Poor vision and conditions such as cataracts can make you more likely to have a fall. If you wear glasses, make sure to get your prescription updated if your vision changes.  Medicine review. Work with your health care provider to regularly review all of the medicines you are taking, including over-the-counter medicines. Ask your health care provider about any side effects that may make you more likely to have a fall. Tell your health care provider if any medicines that you take make you feel dizzy or sleepy.  Osteoporosis screening. Osteoporosis is a condition that causes the bones to get weaker. This can make the bones weak and cause them to break more easily.  Blood pressure screening. Blood pressure changes and medicines to control blood pressure can make you feel dizzy.  Strength and balance checks. Your health care provider may recommend certain tests to check your  strength and balance while standing, walking, or changing positions.  Foot health exam. Foot pain and numbness, as well as not wearing proper footwear, can make you more likely to have a fall.  Depression screening. You may be more likely to have a fall if you have a fear of falling, feel emotionally low, or feel unable to do activities that you used to do.  Alcohol use screening. Using too much alcohol can affect your balance and may make you more likely to have a fall. What actions can I take to lower my risk of falls? General instructions  Talk with your health care provider about your risks for falling. Tell your health care provider if: ? You fall. Be sure to tell your health care provider about all falls, even ones that seem minor. ? You feel dizzy, sleepy, or off-balance.  Take over-the-counter and prescription medicines only as told by your health care provider. These include any supplements.  Eat a healthy diet and maintain a healthy weight. A healthy diet includes low-fat dairy products, low-fat (lean) meats, and fiber from whole grains, beans, and lots of fruits and vegetables. Home safety  Remove any tripping hazards, such as rugs, cords, and clutter.  Install safety equipment such as grab bars in bathrooms and safety rails on stairs.  Keep rooms and walkways well-lit. Activity   Follow a regular exercise program to stay fit. This will help you maintain your balance. Ask your health care provider what types of exercise are appropriate for you.  If you need a cane or   walker, use it as recommended by your health care provider.  Wear supportive shoes that have nonskid soles. Lifestyle  Do not drink alcohol if your health care provider tells you not to drink.  If you drink alcohol, limit how much you have: ? 0-1 drink a day for women. ? 0-2 drinks a day for men.  Be aware of how much alcohol is in your drink. In the [place] one drink equals one typical bottle of beer (12  oz), one-half glass of wine (5 oz), or one shot of hard liquor (1 oz).  Do not use any products that contain nicotine or tobacco, such as cigarettes and e-cigarettes. If you need help quitting, ask your health care provider. Summary  Having a healthy lifestyle and getting preventive care can help to protect your health and wellness after age 69.  Screening and testing are the best way to find a health problem early and help you avoid having a fall. Early diagnosis and treatment give you the best chance for managing medical conditions that are more common for people who are older than age 69.  Falls are a major cause of broken bones and head injuries in people who are older than age 69. Take precautions to prevent a fall at home.  Work with your health care provider to learn what changes you can make to improve your health and wellness and to prevent falls. This information is not intended to replace advice given to you by your health care provider. Make sure you discuss any questions you have with your health care provider. Document Released: 09/14/2017 Document Revised: 02/22/2019 Document Reviewed: 09/14/2017 Elsevier Patient Education  2020 Elsevier Inc.  

## 2019-06-08 NOTE — Assessment & Plan Note (Signed)
Controlled off meds  Will monitor 

## 2019-06-08 NOTE — Assessment & Plan Note (Addendum)
Currently managed on Losartan.  BP in office elevated but he checks it at home and range 120s/70s. Edema noted to RLE, offered to add HCTZ to his regimen, he declined at this time says it doesn't bother him. CBC, CMP today. Reinforced DASH diet and increasing exercise.

## 2019-06-08 NOTE — Progress Notes (Signed)
HPI:  Patient presents to the clinic today for his subsequent Medicare Wellness Examination.  He is also due to follow up on chronic conditions.  HTN:  His BP today is 140/90. He checks his BP at home, runs 120/70.  He is taking Losartan as prescribed.  There is no ECG on file.  HLD:  His last LDL was.  He takes Simvastatin, Fish Oil, and Zetia as prescribed.  He does not consume a low fat diet.  ED:  He takes Sildenafil as needed for this.  COPD:  He denies cough or SOB.  He is currently not taking any medications for this. He no longer smokes.  Past Medical History:  Diagnosis Date  . History of chicken pox     Current Outpatient Medications  Medication Sig Dispense Refill  . ezetimibe (ZETIA) 10 MG tablet TAKE 1 TABLET (10 MG TOTAL) BY MOUTH DAILY. 90 tablet 0  . losartan (COZAAR) 100 MG tablet TAKE 1 TABLET EVERY DAY 90 tablet 0  . Multiple Vitamins-Minerals (CENTRUM ADULTS PO) Take 1 capsule by mouth daily.    . Omega-3 Fatty Acids (FISH OIL) 1000 MG CAPS Take 1,000 mg by mouth 2 (two) times daily.    . sildenafil (REVATIO) 20 MG tablet Take 2-5 tablets as needed for sexual encounter. MUST SCHEDULE ANNUAL EXAM 50 tablet 0  . simvastatin (ZOCOR) 10 MG tablet TAKE 1 TABLET DAILY AT 6:00PM 90 tablet 0   No current facility-administered medications for this visit.     No Known Allergies  Family History  Problem Relation Age of Onset  . Hyperlipidemia Sister   . Heart disease Brother        heart transplant  . Kidney cancer Brother   . Depression Neg Hx   . Stroke Neg Hx     Social History   Socioeconomic History  . Marital status: Divorced    Spouse name: Not on file  . Number of children: Not on file  . Years of education: Not on file  . Highest education level: Not on file  Occupational History  . Not on file  Social Needs  . Financial resource strain: Not on file  . Food insecurity    Worry: Not on file    Inability: Not on file  . Transportation needs     Medical: Not on file    Non-medical: Not on file  Tobacco Use  . Smoking status: Former Smoker    Types: Cigarettes  . Smokeless tobacco: Never Used  Substance and Sexual Activity  . Alcohol use: Yes    Alcohol/week: 0.0 standard drinks    Comment: occasional  . Drug use: No  . Sexual activity: Yes  Lifestyle  . Physical activity    Days per week: Not on file    Minutes per session: Not on file  . Stress: Not on file  Relationships  . Social Herbalist on phone: Not on file    Gets together: Not on file    Attends religious service: Not on file    Active member of club or organization: Not on file    Attends meetings of clubs or organizations: Not on file    Relationship status: Not on file  . Intimate partner violence    Fear of current or ex partner: Not on file    Emotionally abused: Not on file    Physically abused: Not on file    Forced sexual activity: Not on file  Other  Topics Concern  . Not on file  Social History Narrative  . Not on file    Hospitiliaztions: None  Health Maintenance:    Flu: 12/2017  Tetanus: 04/2015  Pneumovax: 11/2016  Prevnar: 04/2015  Zostavax: never  Shingrix: never  PSA: 11/2016  Colon Screening:  12/2016- Cologuard  Eye Doctor: annually- Dr. Quay Burow  Dental Exam: biannually   Providers:   PCP: Webb Silversmith, NP     I have personally reviewed and have noted:  1. The patient's medical and social history 2. Their use of alcohol, tobacco or illicit drugs 3. Their current medications and supplements 4. The patient's functional ability including ADL's, fall risks, home safety risks and hearing or visual impairment. 5. Diet and physical activities 6. Evidence for depression or mood disorder  Subjective:   Review of Systems:   Constitutional: Denies fever, malaise, fatigue, headache or abrupt weight changes.  HEENT: Denies eye pain, eye redness, ear pain, ringing in the ears, wax buildup, runny nose, nasal congestion,  bloody nose, or sore throat. Respiratory: Denies difficulty breathing, shortness of breath, cough or sputum production.   Cardiovascular: Denies chest pain, chest tightness, palpitations or swelling in the hands or feet.  Gastrointestinal: Denies abdominal pain, bloating, constipation, diarrhea or blood in the stool.  GU: Pt reports erectile dysfunction. Denies urgency, frequency, pain with urination, burning sensation, blood in urine, odor or discharge. Musculoskeletal: Denies decrease in range of motion, difficulty with gait, muscle pain or joint pain and swelling.  Skin: Denies redness, rashes, lesions or ulcercations.  Neurological: Denies dizziness, difficulty with memory, difficulty with speech or problems with balance and coordination.  Psych: Denies anxiety, depression, SI/HI.  No other specific complaints in a complete review of systems (except as listed in HPI above).  Objective:  PE:   BP 140/90   Pulse 97   Temp 98.6 F (37 C) (Temporal)   Ht 5\' 7"  (1.702 m)   Wt 200 lb (90.7 kg)   SpO2 97%   BMI 31.32 kg/m    Wt Readings from Last 3 Encounters:  01/02/18 189 lb (85.7 kg)  02/18/17 187 lb 12 oz (85.2 kg)  01/27/17 186 lb (84.4 kg)    General: Appears his stated age, obese,  in NAD. Skin: Warm, dry and intact. No rashes noted. HEENT: Head: normal shape and size; Eyes: sclera white, no icterus, conjunctiva pink, PERRLA and EOMs intact; Ears: Tm's gray and intact, normal light reflex;  Neck: Neck supple, trachea midline. No masses, lumps or thyromegaly present.  Cardiovascular: 1+ pitting edema noted to RLE.  Normal rate and rhythm. S1,S2 noted.  No murmur, rubs or gallops noted. No JVD. No carotid bruits noted. Pulmonary/Chest: Normal effort and positive vesicular breath sounds. No respiratory distress. No wheezes, rales or ronchi noted.  Abdomen: Distended, taut, and nontender. Normal bowel sounds. No masses noted. Liver, spleen and kidneys non  palpable. Musculoskeletal: Strength 5/5 BUE/BLE. No signs of joint swelling.  Neurological: Alert and oriented. Cranial nerves II-XII grossly intact. Coordination normal.  Psychiatric: Mood and affect normal. Behavior is normal. Judgment and thought content normal.     BMET    Component Value Date/Time   NA 138 02/15/2019 1430   K 4.2 02/15/2019 1430   CL 103 02/15/2019 1430   CO2 25 02/15/2019 1430   GLUCOSE 110 (H) 02/15/2019 1430   BUN 27 (H) 02/15/2019 1430   CREATININE 1.34 02/15/2019 1430   CREATININE 1.14 04/08/2017 1604   CALCIUM 9.7 02/15/2019 1430  Lipid Panel     Component Value Date/Time   CHOL 180 02/15/2019 1430   TRIG 347.0 (H) 02/15/2019 1430   HDL 34.50 (L) 02/15/2019 1430   CHOLHDL 5 02/15/2019 1430   VLDL 69.4 (H) 02/15/2019 1430   LDLCALC  10/21/2017 1614     Comment:     . LDL cholesterol not calculated. Triglyceride levels greater than 400 mg/dL invalidate calculated LDL results. . Reference range: <100 . Desirable range <100 mg/dL for primary prevention;   <70 mg/dL for patients with CHD or diabetic patients  with > or = 2 CHD risk factors. Marland Kitchen LDL-C is now calculated using the Martin-Hopkins  calculation, which is a validated novel method providing  better accuracy than the Friedewald equation in the  estimation of LDL-C.  Cresenciano Genre et al. Annamaria Helling. 9458;592(92): 2061-2068  (http://education.[facility].com/faq/FAQ164)     CBC    Component Value Date/Time   WBC 9.9 01/02/2018 1553   RBC 4.62 01/02/2018 1553   HGB 14.1 01/02/2018 1553   HCT 41.3 01/02/2018 1553   PLT 335.0 01/02/2018 1553   MCV 89.5 01/02/2018 1553   MCHC 34.1 01/02/2018 1553   RDW 13.3 01/02/2018 1553    Hgb A1C No results found for: HGBA1C    Assessment and Plan:   Medicare Annual Wellness Visit:  Diet: He does eat meat. He consumes fruits and veggies daily. He does eat some fried foods. He drinks mostly water, semi sweet tea. Physical activity:  Walking Depression/mood screen: Negative, PHQ 9 score of 0 Hearing: Intact to whispered voice Visual acuity: Grossly normal, performs annual eye exam  ADLs: Capable Fall risk: None Home safety: Good Cognitive evaluation: Intact to orientation, naming, recall and repetition EOL planning: Adv directives, full code/ I agree  Preventative Medicine: Encouraged him to get a flu shot in the fall. Tetanus, pneumovax and prevnar UTD. He declines Shingrix or Zostvax for financial reasons. Will repeat Cologuard in 2021. Encouraged him to consume a balanced diet and exercise regimen. Advised him to see an eye doctor and dentist annually. Will check CBC, CMET, Lipid and PSA. Dates of screenings given to pt as part of his AVS.   Next appointment: 6 months, follow up chronic conditions   Webb Silversmith, NP

## 2019-06-08 NOTE — Assessment & Plan Note (Signed)
Currently managed on Simvastatin, Fish Oil, and Zetia. Lipids checked today. Will follow up after labs result. Encouraged low fat diet and increasing exercise.

## 2019-06-08 NOTE — Assessment & Plan Note (Signed)
Continue Sildenafil as needed 

## 2019-06-09 LAB — COMPREHENSIVE METABOLIC PANEL
AG Ratio: 1.6 (calc) (ref 1.0–2.5)
ALT: 38 U/L (ref 9–46)
AST: 29 U/L (ref 10–35)
Albumin: 4.4 g/dL (ref 3.6–5.1)
Alkaline phosphatase (APISO): 61 U/L (ref 35–144)
BUN: 22 mg/dL (ref 7–25)
CO2: 22 mmol/L (ref 20–32)
Calcium: 9.8 mg/dL (ref 8.6–10.3)
Chloride: 103 mmol/L (ref 98–110)
Creat: 1.19 mg/dL (ref 0.70–1.25)
Globulin: 2.8 g/dL (calc) (ref 1.9–3.7)
Glucose, Bld: 88 mg/dL (ref 65–99)
Potassium: 4.3 mmol/L (ref 3.5–5.3)
Sodium: 139 mmol/L (ref 135–146)
Total Bilirubin: 0.9 mg/dL (ref 0.2–1.2)
Total Protein: 7.2 g/dL (ref 6.1–8.1)

## 2019-06-09 LAB — CBC
HCT: 45.6 % (ref 38.5–50.0)
Hemoglobin: 15.4 g/dL (ref 13.2–17.1)
MCH: 29.9 pg (ref 27.0–33.0)
MCHC: 33.8 g/dL (ref 32.0–36.0)
MCV: 88.5 fL (ref 80.0–100.0)
MPV: 11.1 fL (ref 7.5–12.5)
Platelets: 281 10*3/uL (ref 140–400)
RBC: 5.15 10*6/uL (ref 4.20–5.80)
RDW: 13.5 % (ref 11.0–15.0)
WBC: 9.8 10*3/uL (ref 3.8–10.8)

## 2019-06-09 LAB — LIPID PANEL
Cholesterol: 169 mg/dL (ref <200)
HDL: 38 mg/dL — ABNORMAL LOW (ref >=40)
Non-HDL Cholesterol (Calc): 131 mg/dL (calc) — ABNORMAL HIGH (ref <130)
Total CHOL/HDL Ratio: 4.4 (calc) (ref <5.0)
Triglycerides: 540 mg/dL — ABNORMAL HIGH (ref <150)

## 2019-06-09 LAB — PSA: PSA: 1 ng/mL (ref <=4.0)

## 2019-06-14 MED ORDER — ROSUVASTATIN CALCIUM 10 MG PO TABS: 10.0000 mg | ORAL_TABLET | Freq: Every day | ORAL | 0 refills | Status: DC

## 2019-06-14 NOTE — Addendum Note (Signed)
Addended by: Lurlean Nanny on: 06/14/2019 05:09 PM   Modules accepted: Orders

## 2019-08-06 ENCOUNTER — Other Ambulatory Visit: Payer: Self-pay | Admitting: Internal Medicine

## 2019-09-14 ENCOUNTER — Other Ambulatory Visit (INDEPENDENT_AMBULATORY_CARE_PROVIDER_SITE_OTHER): Payer: Medicare HMO

## 2019-09-14 DIAGNOSIS — E782 Mixed hyperlipidemia: Secondary | ICD-10-CM

## 2019-09-14 LAB — COMPREHENSIVE METABOLIC PANEL
ALT: 66 U/L — ABNORMAL HIGH (ref 0–53)
AST: 52 U/L — ABNORMAL HIGH (ref 0–37)
Albumin: 4.5 g/dL (ref 3.5–5.2)
Alkaline Phosphatase: 67 U/L (ref 39–117)
BUN: 21 mg/dL (ref 6–23)
CO2: 26 mEq/L (ref 19–32)
Calcium: 9.7 mg/dL (ref 8.4–10.5)
Chloride: 103 mEq/L (ref 96–112)
Creatinine, Ser: 1.06 mg/dL (ref 0.40–1.50)
GFR: 69.19 mL/min (ref >60.00)
Glucose, Bld: 117 mg/dL — ABNORMAL HIGH (ref 70–99)
Potassium: 4.1 mEq/L (ref 3.5–5.1)
Sodium: 139 mEq/L (ref 135–145)
Total Bilirubin: 1.1 mg/dL (ref 0.2–1.2)
Total Protein: 7.2 g/dL (ref 6.0–8.3)

## 2019-09-14 LAB — LIPID PANEL
Cholesterol: 114 mg/dL (ref 0–200)
HDL: 36.7 mg/dL — ABNORMAL LOW (ref >39.00)
NonHDL: 77.45
Total CHOL/HDL Ratio: 3
Triglycerides: 348 mg/dL — ABNORMAL HIGH (ref 0.0–149.0)
VLDL: 69.6 mg/dL — ABNORMAL HIGH (ref 0.0–40.0)

## 2019-09-14 LAB — LDL CHOLESTEROL, DIRECT: Direct LDL: 40 mg/dL

## 2019-09-21 DIAGNOSIS — H5213 Myopia, bilateral: Secondary | ICD-10-CM | POA: Diagnosis not present

## 2019-09-26 ENCOUNTER — Ambulatory Visit (INDEPENDENT_AMBULATORY_CARE_PROVIDER_SITE_OTHER): Payer: Medicare HMO

## 2019-09-26 DIAGNOSIS — Z23 Encounter for immunization: Secondary | ICD-10-CM

## 2019-10-29 ENCOUNTER — Other Ambulatory Visit: Payer: Self-pay | Admitting: Internal Medicine

## 2019-12-04 ENCOUNTER — Ambulatory Visit: Payer: Medicare HMO | Attending: Internal Medicine

## 2019-12-04 DIAGNOSIS — Z23 Encounter for immunization: Secondary | ICD-10-CM | POA: Insufficient documentation

## 2019-12-04 NOTE — Progress Notes (Signed)
   Covid-19 Vaccination Clinic  Name:  Charles Smith    MRN: [id number] DOB: [birth year]  12/04/2019  Charles Smith was observed post Covid-19 immunization for 15 minutes without incidence. He was provided with Vaccine Information Sheet and instruction to access the V-Safe system.   Charles Smith was instructed to call 911 with any severe reactions post vaccine: Marland Kitchen Difficulty breathing  . Swelling of your face and throat  . A fast heartbeat  . A bad rash all over your body  . Dizziness and weakness    Immunizations Administered    Name Date Dose VIS Date Route   Pfizer COVID-19 Vaccine 12/04/2019 11:32 AM 0.3 mL 10/26/2019 Intramuscular   Manufacturer: [place]   Lot: [id number]   [place]: [id number]

## 2019-12-24 ENCOUNTER — Ambulatory Visit: Payer: Medicare HMO | Attending: Internal Medicine

## 2019-12-24 DIAGNOSIS — Z23 Encounter for immunization: Secondary | ICD-10-CM

## 2019-12-24 NOTE — Progress Notes (Signed)
   Covid-19 Vaccination Clinic  Name:  Charles Smith    MRN: [id number] DOB: Aug 26, 1951  12/24/2019  Charles Smith was observed post Covid-19 immunization for 15 minutes without incidence. He was provided with Vaccine Information Sheet and instruction to access the V-Safe system.   Charles Smith was instructed to call 911 with any severe reactions post vaccine: Marland Kitchen Difficulty breathing  . Swelling of your face and throat  . A fast heartbeat  . A bad rash all over your body  . Dizziness and weakness    Immunizations Administered    Name Date Dose VIS Date Route   Pfizer COVID-19 Vaccine 12/24/2019  9:43 AM 0.3 mL 10/26/2019 Intramuscular   Manufacturer: [place]   Lot: [id number]   [place]: [id number]

## 2020-01-10 ENCOUNTER — Other Ambulatory Visit: Payer: Self-pay | Admitting: Internal Medicine

## 2020-01-10 NOTE — Telephone Encounter (Signed)
Pt needs to schedule lab only appt

## 2020-02-29 ENCOUNTER — Other Ambulatory Visit: Payer: Self-pay | Admitting: Internal Medicine

## 2020-05-11 ENCOUNTER — Other Ambulatory Visit: Payer: Self-pay | Admitting: Internal Medicine

## 2020-06-16 ENCOUNTER — Encounter: Payer: Medicare HMO | Admitting: Internal Medicine

## 2020-06-23 ENCOUNTER — Ambulatory Visit (INDEPENDENT_AMBULATORY_CARE_PROVIDER_SITE_OTHER): Payer: Medicare HMO | Admitting: Internal Medicine

## 2020-06-23 ENCOUNTER — Encounter: Payer: Self-pay | Admitting: Internal Medicine

## 2020-06-23 ENCOUNTER — Telehealth: Payer: Self-pay

## 2020-06-23 ENCOUNTER — Other Ambulatory Visit: Payer: Self-pay

## 2020-06-23 VITALS — BP 138/86 | HR 88 | Temp 98.1°F | Ht 67.0 in | Wt 202.0 lb

## 2020-06-23 DIAGNOSIS — N529 Male erectile dysfunction, unspecified: Secondary | ICD-10-CM | POA: Diagnosis not present

## 2020-06-23 DIAGNOSIS — Z125 Encounter for screening for malignant neoplasm of prostate: Secondary | ICD-10-CM

## 2020-06-23 DIAGNOSIS — E782 Mixed hyperlipidemia: Secondary | ICD-10-CM

## 2020-06-23 DIAGNOSIS — Z0000 Encounter for general adult medical examination without abnormal findings: Secondary | ICD-10-CM | POA: Diagnosis not present

## 2020-06-23 DIAGNOSIS — J410 Simple chronic bronchitis: Secondary | ICD-10-CM | POA: Diagnosis not present

## 2020-06-23 DIAGNOSIS — Z1211 Encounter for screening for malignant neoplasm of colon: Secondary | ICD-10-CM

## 2020-06-23 DIAGNOSIS — I10 Essential (primary) hypertension: Secondary | ICD-10-CM | POA: Diagnosis not present

## 2020-06-23 LAB — COMPREHENSIVE METABOLIC PANEL
ALT: 57 U/L — ABNORMAL HIGH (ref 0–53)
AST: 42 U/L — ABNORMAL HIGH (ref 0–37)
Albumin: 4.4 g/dL (ref 3.5–5.2)
Alkaline Phosphatase: 64 U/L (ref 39–117)
BUN: 15 mg/dL (ref 6–23)
CO2: 24 mEq/L (ref 19–32)
Calcium: 9.7 mg/dL (ref 8.4–10.5)
Chloride: 101 mEq/L (ref 96–112)
Creatinine, Ser: 1.17 mg/dL (ref 0.40–1.50)
GFR: 61.6 mL/min (ref >60.00)
Glucose, Bld: 181 mg/dL — ABNORMAL HIGH (ref 70–99)
Potassium: 4.6 mEq/L (ref 3.5–5.1)
Sodium: 137 mEq/L (ref 135–145)
Total Bilirubin: 1 mg/dL (ref 0.2–1.2)
Total Protein: 7.5 g/dL (ref 6.0–8.3)

## 2020-06-23 LAB — CBC
HCT: 44.3 % (ref 39.0–52.0)
Hemoglobin: 15.1 g/dL (ref 13.0–17.0)
MCHC: 34 g/dL (ref 30.0–36.0)
MCV: 88.5 fl (ref 78.0–100.0)
Platelets: 242 10*3/uL (ref 150.0–400.0)
RBC: 5.01 Mil/uL (ref 4.22–5.81)
RDW: 14 % (ref 11.5–15.5)
WBC: 11 10*3/uL — ABNORMAL HIGH (ref 4.0–10.5)

## 2020-06-23 LAB — PSA, MEDICARE: PSA: 0.91 ng/ml (ref 0.10–4.00)

## 2020-06-23 LAB — LIPID PANEL
Cholesterol: 126 mg/dL (ref 0–200)
HDL: 39.3 mg/dL (ref >39.00)
Total CHOL/HDL Ratio: 3
Triglycerides: 411 mg/dL — ABNORMAL HIGH (ref 0.0–149.0)

## 2020-06-23 LAB — LDL CHOLESTEROL, DIRECT: Direct LDL: 40 mg/dL

## 2020-06-23 MED ORDER — SILDENAFIL CITRATE 50 MG PO TABS
25.0000 mg | ORAL_TABLET | Freq: Every day | ORAL | 2 refills | Status: DC | PRN
Start: 2020-06-23 — End: 2021-12-31

## 2020-06-23 NOTE — Assessment & Plan Note (Signed)
Continue Sildenafil as needed 

## 2020-06-23 NOTE — Telephone Encounter (Signed)
Cologuard order faxed.

## 2020-06-23 NOTE — Progress Notes (Addendum)
HPI:  Patient presents the clinic today for his subsequent annual Medicare wellness exam.  He is also due to follow-up chronic conditions.  HTN: His BP today is 138/86.  He is taking Losartan as prescribed.  There is no ECG on file.  HLD: His last LDL was 40, 08/2019.  He denies myalgias on Simvastatin, Fish Oil and Ezetimibe.  He does not consume a low-fat diet.  ED: He has difficulty maintaining an erection.  He takes Sildenafil as needed with good results.  COPD: He denies chronic cough or shortness of breath.  He is not using any inhalers at this time.  He no longer smokes.  There are no PFTs on file.   Past Medical History:  Diagnosis Date  . History of chicken pox     Current Outpatient Medications  Medication Sig Dispense Refill  . Ascorbic Acid (VITAMIN C) 1000 MG tablet Take 1,000 mg by mouth daily.    Marland Kitchen ezetimibe (ZETIA) 10 MG tablet TAKE 1 TABLET EVERY DAY 90 tablet 0  . losartan (COZAAR) 100 MG tablet TAKE 1 TABLET EVERY DAY 90 tablet 1  . Multiple Vitamins-Minerals (CENTRUM ADULTS PO) Take 1 capsule by mouth daily.    . Omega-3 Fatty Acids (FISH OIL) 1000 MG CAPS Take 1,000 mg by mouth daily.     . rosuvastatin (CRESTOR) 10 MG tablet TAKE 1 TABLET EVERY DAY 90 tablet 0  . sildenafil (REVATIO) 20 MG tablet Take 2-5 tablets as needed for sexual encounter. MUST SCHEDULE ANNUAL EXAM 50 tablet 0   No current facility-administered medications for this visit.    No Known Allergies  Family History  Problem Relation Age of Onset  . Hyperlipidemia Sister   . Heart disease Brother        heart transplant  . Kidney cancer Brother   . Depression Neg Hx   . Stroke Neg Hx     Social History   Socioeconomic History  . Marital status: Divorced    Spouse name: Not on file  . Number of children: Not on file  . Years of education: Not on file  . Highest education level: Not on file  Occupational History  . Not on file  Tobacco Use  . Smoking status: Former Smoker     Types: Cigarettes  . Smokeless tobacco: Never Used  Substance and Sexual Activity  . Alcohol use: Yes    Alcohol/week: 0.0 standard drinks    Comment: occasional  . Drug use: No  . Sexual activity: Yes  Other Topics Concern  . Not on file  Social History Narrative  . Not on file   Social Determinants of Health   Financial Resource Strain:   . Difficulty of Paying Living Expenses:   Food Insecurity:   . Worried About Charity fundraiser in the Last Year:   . Arboriculturist in the Last Year:   Transportation Needs:   . Film/video editor (Medical):   Marland Kitchen Lack of Transportation (Non-Medical):   Physical Activity:   . Days of Exercise per Week:   . Minutes of Exercise per Session:   Stress:   . Feeling of Stress :   Social Connections:   . Frequency of Communication with Friends and Family:   . Frequency of Social Gatherings with Friends and Family:   . Attends Religious Services:   . Active Member of Clubs or Organizations:   . Attends Archivist Meetings:   Marland Kitchen Marital Status:   Intimate  Partner Violence:   . Fear of Current or Ex-Partner:   . Emotionally Abused:   Marland Kitchen Physically Abused:   . Sexually Abused:     Hospitiliaztions: None  Health Maintenance:    Flu: 09/2019  Tetanus: 04/2015  Pneumovax: 11/2016  Prevnar: 04/2015  Zostavax: never  Shingrix: never  Covid: Pfizer  PSA: 05/2019  Colon Screening: 12/2016, Cologuard  Eye Doctor: annually  Dental Exam: biannually   Providers:   PCP: Webb Silversmith, NP   I have personally reviewed and have noted:  1. The patient's medical and social history 2. Their use of alcohol, tobacco or illicit drugs 3. Their current medications and supplements 4. The patient's functional ability including ADL's, fall risks, home safety risks and hearing or visual impairment. 5. Diet and physical activities 6. Evidence for depression or mood disorder  Subjective:   Review of Systems:   Constitutional: Denies  fever, malaise, fatigue, headache or abrupt weight changes.  HEENT: Denies eye pain, eye redness, ear pain, ringing in the ears, wax buildup, runny nose, nasal congestion, bloody nose, or sore throat. Respiratory: Denies difficulty breathing, shortness of breath, cough or sputum production.   Cardiovascular: Pt reports intermittent swelling of RLE. Denies chest pain, chest tightness, palpitations or swelling in the hands.  Gastrointestinal: Denies abdominal pain, bloating, constipation, diarrhea or blood in the stool.  GU: Patient reports erectile dysfunction.  Denies urgency, frequency, pain with urination, burning sensation, blood in urine, odor or discharge. Musculoskeletal: Denies decrease in range of motion, difficulty with gait, muscle pain or joint pain and swelling.  Skin: Denies redness, rashes, lesions or ulcercations.  Neurological: Denies dizziness, difficulty with memory, difficulty with speech or problems with balance and coordination.  Psych: Denies anxiety, depression, SI/HI.  No other specific complaints in a complete review of systems (except as listed in HPI above).  Objective:  PE:   BP 138/86   Pulse 88   Temp 98.1 F (36.7 C) (Temporal)   Ht 5' 7"  (1.702 m)   Wt 202 lb (91.6 kg)   SpO2 97%   BMI 31.64 kg/m   Wt Readings from Last 3 Encounters:  06/08/19 200 lb (90.7 kg)  01/02/18 189 lb (85.7 kg)  02/18/17 187 lb 12 oz (85.2 kg)    General: Appears his stated age, obese, in NAD. Skin: Warm, dry and intact. No rashes noted. HEENT: Head: normal shape and size; Eyes: sclera white, no icterus, conjunctiva pink, PERRLA and EOMs intact;  Neck: Neck supple, trachea midline. No masses, lumps or thyromegaly present.  Cardiovascular: Normal rate and rhythm. S1,S2 noted.  No murmur, rubs or gallops noted. Trace RLE edema. No carotid bruits noted. Pulmonary/Chest: Normal effort and positive vesicular breath sounds. No respiratory distress. No wheezes, rales or ronchi  noted.  Abdomen: Soft and nontender. Normal bowel sounds. Umbilical and abdominal wall hernias noted. Liver, spleen and kidneys non palpable. Musculoskeletal:  Strength 5/5 BUE/BLE. No signs of joint swelling.  Neurological: Alert and oriented. Cranial nerves II-XII grossly intact. Coordination normal.  Psychiatric: Mood and affect normal. Behavior is normal. Judgment and thought content normal.     BMET    Component Value Date/Time   NA 139 09/14/2019 1353   K 4.1 09/14/2019 1353   CL 103 09/14/2019 1353   CO2 26 09/14/2019 1353   GLUCOSE 117 (H) 09/14/2019 1353   BUN 21 09/14/2019 1353   CREATININE 1.06 09/14/2019 1353   CREATININE 1.19 06/08/2019 1515   CALCIUM 9.7 09/14/2019 1353  Lipid Panel     Component Value Date/Time   CHOL 114 09/14/2019 1353   TRIG 348.0 (H) 09/14/2019 1353   HDL 36.70 (L) 09/14/2019 1353   CHOLHDL 3 09/14/2019 1353   VLDL 69.6 (H) 09/14/2019 1353   LDLCALC  06/08/2019 1515     Comment:     . LDL cholesterol not calculated. Triglyceride levels greater than 400 mg/dL invalidate calculated LDL results. . Reference range: <100 . Desirable range <100 mg/dL for primary prevention;   <70 mg/dL for patients with CHD or diabetic patients  with > or = 2 CHD risk factors. Marland Kitchen LDL-C is now calculated using the Martin-Hopkins  calculation, which is a validated novel method providing  better accuracy than the Friedewald equation in the  estimation of LDL-C.  Cresenciano Genre et al. Annamaria Helling. 0160;109(32): 2061-2068  (http://education.[facility].com/faq/FAQ164)     CBC    Component Value Date/Time   WBC 9.8 06/08/2019 1515   RBC 5.15 06/08/2019 1515   HGB 15.4 06/08/2019 1515   HCT 45.6 06/08/2019 1515   PLT 281 06/08/2019 1515   MCV 88.5 06/08/2019 1515   MCH 29.9 06/08/2019 1515   MCHC 33.8 06/08/2019 1515   RDW 13.5 06/08/2019 1515    Hgb A1C No results found for: HGBA1C    Assessment and Plan:   Medicare Annual Wellness  Visit:  Diet: He does eat meat. He consumes fruits and veggies daily. He does eat some fried foods. He drinks mostly water, lemonade and unsweet tea. Physical activity: Walking Depression/mood screen: Negative, PHQ 9 score of 0 Hearing: Intact to whispered voice Visual acuity: Grossly normal, performs annual eye exam  ADLs: Capable Fall risk: None Home safety: Good Cognitive evaluation: Intact to orientation, naming, recall and repetition EOL planning: Adv directives, full code/ I agree  Preventative Medicine: Encouraged him to get a flu shot in the fall.  Tetanus, Pneumovax, Prevnar and Covid UTD.  He will consider Shingrix vaccine in is aware he should get this at the pharmacy.  Repeat Cologuard ordered.  Encouraged him to consume a balanced diet and exercise regimen.  Advised him to see an eye doctor and dentist annually.  Will check CBC, C met, lipid and PSA today.  Due dates for screening exam given to patient as part of his AVS.   Next appointment: 1 year, Medicare wellness exam.   Webb Silversmith, NP This visit occurred during the SARS-CoV-2 public health emergency.  Safety protocols were in place, including screening questions prior to the visit, additional usage of staff PPE, and extensive cleaning of exam room while observing appropriate contact time as indicated for disinfecting solutions.

## 2020-06-23 NOTE — Patient Instructions (Signed)
Health Maintenance After Age 70 After age 70, you are at a higher risk for certain long-term diseases and infections as well as injuries from falls. Falls are a major cause of broken bones and head injuries in people who are older than age 70. Getting regular preventive care can help to keep you healthy and well. Preventive care includes getting regular testing and making lifestyle changes as recommended by your health care provider. Talk with your health care provider about:  Which screenings and tests you should have. A screening is a test that checks for a disease when you have no symptoms.  A diet and exercise plan that is right for you. What should I know about screenings and tests to prevent falls? Screening and testing are the best ways to find a health problem early. Early diagnosis and treatment give you the best chance of managing medical conditions that are common after age 70. Certain conditions and lifestyle choices may make you more likely to have a fall. Your health care provider may recommend:  Regular vision checks. Poor vision and conditions such as cataracts can make you more likely to have a fall. If you wear glasses, make sure to get your prescription updated if your vision changes.  Medicine review. Work with your health care provider to regularly review all of the medicines you are taking, including over-the-counter medicines. Ask your health care provider about any side effects that may make you more likely to have a fall. Tell your health care provider if any medicines that you take make you feel dizzy or sleepy.  Osteoporosis screening. Osteoporosis is a condition that causes the bones to get weaker. This can make the bones weak and cause them to break more easily.  Blood pressure screening. Blood pressure changes and medicines to control blood pressure can make you feel dizzy.  Strength and balance checks. Your health care provider may recommend certain tests to check your  strength and balance while standing, walking, or changing positions.  Foot health exam. Foot pain and numbness, as well as not wearing proper footwear, can make you more likely to have a fall.  Depression screening. You may be more likely to have a fall if you have a fear of falling, feel emotionally low, or feel unable to do activities that you used to do.  Alcohol use screening. Using too much alcohol can affect your balance and may make you more likely to have a fall. What actions can I take to lower my risk of falls? General instructions  Talk with your health care provider about your risks for falling. Tell your health care provider if: ? You fall. Be sure to tell your health care provider about all falls, even ones that seem minor. ? You feel dizzy, sleepy, or off-balance.  Take over-the-counter and prescription medicines only as told by your health care provider. These include any supplements.  Eat a healthy diet and maintain a healthy weight. A healthy diet includes low-fat dairy products, low-fat (lean) meats, and fiber from whole grains, beans, and lots of fruits and vegetables. Home safety  Remove any tripping hazards, such as rugs, cords, and clutter.  Install safety equipment such as grab bars in bathrooms and safety rails on stairs.  Keep rooms and walkways well-lit. Activity   Follow a regular exercise program to stay fit. This will help you maintain your balance. Ask your health care provider what types of exercise are appropriate for you.  If you need a cane or   walker, use it as recommended by your health care provider.  Wear supportive shoes that have nonskid soles. Lifestyle  Do not drink alcohol if your health care provider tells you not to drink.  If you drink alcohol, limit how much you have: ? 0-1 drink a day for women. ? 0-2 drinks a day for men.  Be aware of how much alcohol is in your drink. In the [place] one drink equals one typical bottle of beer (12  oz), one-half glass of wine (5 oz), or one shot of hard liquor (1 oz).  Do not use any products that contain nicotine or tobacco, such as cigarettes and e-cigarettes. If you need help quitting, ask your health care provider. Summary  Having a healthy lifestyle and getting preventive care can help to protect your health and wellness after age 70.  Screening and testing are the best way to find a health problem early and help you avoid having a fall. Early diagnosis and treatment give you the best chance for managing medical conditions that are more common for people who are older than age 70.  Falls are a major cause of broken bones and head injuries in people who are older than age 70. Take precautions to prevent a fall at home.  Work with your health care provider to learn what changes you can make to improve your health and wellness and to prevent falls. This information is not intended to replace advice given to you by your health care provider. Make sure you discuss any questions you have with your health care provider. Document Revised: 02/22/2019 Document Reviewed: 09/14/2017 Elsevier Patient Education  2020 Elsevier Inc.  

## 2020-06-23 NOTE — Assessment & Plan Note (Signed)
Controlled on Losartan Reinforced DASH diet and exercise for weight loss CMET today 

## 2020-06-23 NOTE — Assessment & Plan Note (Signed)
CMET and Lipid profile today Encouraged her to consume a low fat diet Continue Rosuvastatin, Fish Oil and Ezetimibe

## 2020-06-23 NOTE — Assessment & Plan Note (Signed)
Stable off inhalers Will monitor

## 2020-06-25 ENCOUNTER — Encounter: Payer: Self-pay | Admitting: Internal Medicine

## 2020-07-02 DIAGNOSIS — Z1211 Encounter for screening for malignant neoplasm of colon: Secondary | ICD-10-CM | POA: Diagnosis not present

## 2020-07-02 LAB — COLOGUARD: Cologuard: NEGATIVE

## 2020-07-09 LAB — COLOGUARD: COLOGUARD: NEGATIVE

## 2020-07-17 ENCOUNTER — Encounter: Payer: Self-pay | Admitting: Internal Medicine

## 2020-07-17 NOTE — Telephone Encounter (Signed)
Cologuard (DNA stool test) result:  NEGATIVE A negative result indicates a lower likelihood that colorectal cancer or pre-cancer is present.

## 2020-07-31 ENCOUNTER — Other Ambulatory Visit: Payer: Self-pay | Admitting: Internal Medicine

## 2020-08-13 ENCOUNTER — Other Ambulatory Visit: Payer: Self-pay | Admitting: Internal Medicine

## 2020-09-13 ENCOUNTER — Ambulatory Visit: Payer: Medicare HMO | Attending: Internal Medicine

## 2020-09-13 DIAGNOSIS — Z23 Encounter for immunization: Secondary | ICD-10-CM

## 2020-09-13 NOTE — Progress Notes (Signed)
   Covid-19 Vaccination Clinic  Name:  Kamarri Lovvorn    MRN: [id number] DOB: [birth year]  09/13/2020  Mr. Stemm was observed post Covid-19 immunization for 15 minutes without incident. He was provided with Vaccine Information Sheet and instruction to access the V-Safe system.   Mr. Laduca was instructed to call 911 with any severe reactions post vaccine: Marland Kitchen Difficulty breathing  . Swelling of face and throat  . A fast heartbeat  . A bad rash all over body  . Dizziness and weakness

## 2020-09-23 DIAGNOSIS — H43813 Vitreous degeneration, bilateral: Secondary | ICD-10-CM | POA: Diagnosis not present

## 2020-09-25 ENCOUNTER — Other Ambulatory Visit: Payer: Self-pay | Admitting: Internal Medicine

## 2020-10-01 DIAGNOSIS — Z0100 Encounter for examination of eyes and vision without abnormal findings: Secondary | ICD-10-CM | POA: Diagnosis not present

## 2020-11-08 ENCOUNTER — Other Ambulatory Visit: Payer: Self-pay | Admitting: Internal Medicine

## 2020-12-31 ENCOUNTER — Other Ambulatory Visit: Payer: Self-pay | Admitting: Internal Medicine

## 2021-03-31 ENCOUNTER — Other Ambulatory Visit: Payer: Self-pay | Admitting: Internal Medicine

## 2021-04-05 ENCOUNTER — Encounter: Payer: Self-pay | Admitting: Internal Medicine

## 2021-04-06 MED ORDER — ROSUVASTATIN CALCIUM 10 MG PO TABS: 10.0000 mg | ORAL_TABLET | Freq: Every day | ORAL | 0 refills | Status: DC

## 2021-05-07 ENCOUNTER — Other Ambulatory Visit: Payer: Self-pay | Admitting: Internal Medicine

## 2021-05-07 NOTE — Telephone Encounter (Signed)
Requested medication (s) are due for refill today: yes  Requested medication (s) are on the active medication list: yes  Last refill:  01/02/21 #90 0 refill  Future visit scheduled: yes in 1 month at [facility]  Notes to clinic:  do you want to give #30 day supply or #90 day supply until future visit in 1 month due to changing practice to [facility] and overdue ecounter     Requested Prescriptions  Pending Prescriptions Disp Refills   ezetimibe (ZETIA) 10 MG tablet [Pharmacy Med Name: EZETIMIBE 10 MG Tablet] 90 tablet 0    Sig: TAKE 1 TABLET EVERY DAY      Cardiovascular:  Antilipid - Sterol Transport Inhibitors Failed - 05/07/2021  1:12 PM      Failed - Triglycerides in normal range and within 360 days    Triglycerides  Date Value Ref Range Status  06/23/2020 (H) 0.0 - 149.0 mg/dL Final   411.0 Triglyceride is over 400; calculations on Lipids are invalid.    Comment:    Normal:  <150 mg/dLBorderline High:  150 - 199 mg/dL          Failed - Valid encounter within last 12 months    Recent Outpatient Visits   None     Future Appointments             In 1 month Baity, Coralie Keens, NP [facility] [place] - Total Cholesterol in normal range and within 360 days    Cholesterol  Date Value Ref Range Status  06/23/2020 126 0 - 200 mg/dL Final    Comment:    ATP III Classification       Desirable:  < 200 mg/dL               Borderline High:  200 - 239 mg/dL          High:  > = 240 mg/dL          Passed - LDL in normal range and within 360 days    LDL Cholesterol (Calc)  Date Value Ref Range Status  06/08/2019  mg/dL (calc) Final    Comment:    . LDL cholesterol not calculated. Triglyceride levels greater than 400 mg/dL invalidate calculated LDL results. . Reference range: <100 . Desirable range <100 mg/dL for primary prevention;   <70 mg/dL for patients with CHD or diabetic patients  with > or = 2 CHD risk factors. Marland Kitchen LDL-C is now calculated  using the Martin-Hopkins  calculation, which is a validated novel method providing  better accuracy than the Friedewald equation in the  estimation of LDL-C.  Cresenciano Genre et al. Annamaria Helling. 4259;563(87): 2061-2068  (http://education.[employer].com/faq/FAQ164)    Direct LDL  Date Value Ref Range Status  06/23/2020 40.0 mg/dL Final    Comment:    Optimal:  <100 mg/dLNear or Above Optimal:  100-129 mg/dLBorderline High:  130-159 mg/dLHigh:  160-189 mg/dLVery High:  >190 mg/dL          Passed - HDL in normal range and within 360 days    HDL  Date Value Ref Range Status  06/23/2020 39.30 >39.00 mg/dL Final

## 2021-05-15 ENCOUNTER — Encounter: Payer: Self-pay | Admitting: Internal Medicine

## 2021-06-10 ENCOUNTER — Other Ambulatory Visit: Payer: Self-pay | Admitting: Internal Medicine

## 2021-06-12 ENCOUNTER — Encounter: Payer: Self-pay | Admitting: Internal Medicine

## 2021-06-26 ENCOUNTER — Encounter: Payer: Self-pay | Admitting: Internal Medicine

## 2021-07-09 ENCOUNTER — Other Ambulatory Visit: Payer: Self-pay

## 2021-07-09 ENCOUNTER — Ambulatory Visit (INDEPENDENT_AMBULATORY_CARE_PROVIDER_SITE_OTHER): Payer: Medicare HMO | Admitting: Internal Medicine

## 2021-07-09 ENCOUNTER — Encounter: Payer: Self-pay | Admitting: Internal Medicine

## 2021-07-09 VITALS — BP 126/74 | HR 88 | Temp 97.8°F | Resp 17 | Ht 67.0 in | Wt 180.0 lb

## 2021-07-09 DIAGNOSIS — E782 Mixed hyperlipidemia: Secondary | ICD-10-CM

## 2021-07-09 DIAGNOSIS — I10 Essential (primary) hypertension: Secondary | ICD-10-CM | POA: Diagnosis not present

## 2021-07-09 DIAGNOSIS — J410 Simple chronic bronchitis: Secondary | ICD-10-CM

## 2021-07-09 DIAGNOSIS — N522 Drug-induced erectile dysfunction: Secondary | ICD-10-CM | POA: Diagnosis not present

## 2021-07-09 DIAGNOSIS — E663 Overweight: Secondary | ICD-10-CM | POA: Insufficient documentation

## 2021-07-09 DIAGNOSIS — Z0000 Encounter for general adult medical examination without abnormal findings: Secondary | ICD-10-CM

## 2021-07-09 DIAGNOSIS — Z6827 Body mass index (BMI) 27.0-27.9, adult: Secondary | ICD-10-CM | POA: Insufficient documentation

## 2021-07-09 DIAGNOSIS — Z6828 Body mass index (BMI) 28.0-28.9, adult: Secondary | ICD-10-CM | POA: Diagnosis not present

## 2021-07-09 DIAGNOSIS — Z125 Encounter for screening for malignant neoplasm of prostate: Secondary | ICD-10-CM | POA: Diagnosis not present

## 2021-07-09 DIAGNOSIS — Z6826 Body mass index (BMI) 26.0-26.9, adult: Secondary | ICD-10-CM | POA: Insufficient documentation

## 2021-07-09 NOTE — Assessment & Plan Note (Signed)
Continue Sildenafil as needed 

## 2021-07-09 NOTE — Assessment & Plan Note (Signed)
Controlled on Losartan ?Reinforced DASH diet and exercise for weight loss ?C-Met today ?

## 2021-07-09 NOTE — Patient Instructions (Signed)
Health Maintenance, Male Adopting a healthy lifestyle and getting preventive care are important in promoting health and wellness. Ask your health care provider about: The right schedule for you to have regular tests and exams. Things you can do on your own to prevent diseases and keep yourself healthy. What should I know about diet, weight, and exercise? Eat a healthy diet  Eat a diet that includes plenty of vegetables, fruits, low-fat dairy products, and lean protein. Do not eat a lot of foods that are high in solid fats, added sugars, or sodium.  Maintain a healthy weight Body mass index (BMI) is a measurement that can be used to identify possible weight problems. It estimates body fat based on height and weight. Your health care provider can help determine your BMI and help you achieve or maintain ahealthy weight. Get regular exercise Get regular exercise. This is one of the most important things you can do for your health. Most adults should: Exercise for at least 150 minutes each week. The exercise should increase your heart rate and make you sweat (moderate-intensity exercise). Do strengthening exercises at least twice a week. This is in addition to the moderate-intensity exercise. Spend less time sitting. Even light physical activity can be beneficial. Watch cholesterol and blood lipids Have your blood tested for lipids and cholesterol at 71 years of age, then havethis test every 5 years. You may need to have your cholesterol levels checked more often if: Your lipid or cholesterol levels are high. You are older than 71 years of age. You are at high risk for heart disease. What should I know about cancer screening? Many types of cancers can be detected early and may often be prevented. Depending on your health history and family history, you may need to have cancer screening at various ages. This may include screening for: Colorectal cancer. Prostate cancer. Skin cancer. Lung  cancer. What should I know about heart disease, diabetes, and high blood pressure? Blood pressure and heart disease High blood pressure causes heart disease and increases the risk of stroke. This is more likely to develop in people who have high blood pressure readings, are of African descent, or are overweight. Talk with your health care provider about your target blood pressure readings. Have your blood pressure checked: Every 3-5 years if you are 18-39 years of age. Every year if you are 40 years old or older. If you are between the ages of 65 and 75 and are a current or former smoker, ask your health care provider if you should have a one-time screening for abdominal aortic aneurysm (AAA). Diabetes Have regular diabetes screenings. This checks your fasting blood sugar level. Have the screening done: Once every three years after age 45 if you are at a normal weight and have a low risk for diabetes. More often and at a younger age if you are overweight or have a high risk for diabetes. What should I know about preventing infection? Hepatitis B If you have a higher risk for hepatitis B, you should be screened for this virus. Talk with your health care provider to find out if you are at risk forhepatitis B infection. Hepatitis C Blood testing is recommended for: Everyone born from 1945 through 1965. Anyone with known risk factors for hepatitis C. Sexually transmitted infections (STIs) You should be screened each year for STIs, including gonorrhea and chlamydia, if: You are sexually active and are younger than 71 years of age. You are older than 71 years of age   and your health care provider tells you that you are at risk for this type of infection. Your sexual activity has changed since you were last screened, and you are at increased risk for chlamydia or gonorrhea. Ask your health care provider if you are at risk. Ask your health care provider about whether you are at high risk for HIV.  Your health care provider may recommend a prescription medicine to help prevent HIV infection. If you choose to take medicine to prevent HIV, you should first get tested for HIV. You should then be tested every 3 months for as long as you are taking the medicine. Follow these instructions at home: Lifestyle Do not use any products that contain nicotine or tobacco, such as cigarettes, e-cigarettes, and chewing tobacco. If you need help quitting, ask your health care provider. Do not use street drugs. Do not share needles. Ask your health care provider for help if you need support or information about quitting drugs. Alcohol use Do not drink alcohol if your health care provider tells you not to drink. If you drink alcohol: Limit how much you have to 0-2 drinks a day. Be aware of how much alcohol is in your drink. In the [place] one drink equals one 12 oz bottle of beer (355 mL), one 5 oz glass of wine (148 mL), or one 1 oz glass of hard liquor (44 mL). General instructions Schedule regular health, dental, and eye exams. Stay current with your vaccines. Tell your health care provider if: You often feel depressed. You have ever been abused or do not feel safe at home. Summary Adopting a healthy lifestyle and getting preventive care are important in promoting health and wellness. Follow your health care provider's instructions about healthy diet, exercising, and getting tested or screened for diseases. Follow your health care provider's instructions on monitoring your cholesterol and blood pressure. This information is not intended to replace advice given to you by your health care provider. Make sure you discuss any questions you have with your healthcare provider. Document Revised: 10/25/2018 Document Reviewed: 10/25/2018 Elsevier Patient Education  2022 Elsevier Inc.  

## 2021-07-09 NOTE — Assessment & Plan Note (Signed)
Asymptomatic Will monitor 

## 2021-07-09 NOTE — Assessment & Plan Note (Signed)
C-Met and lipid profile today Encouraged him to consume a low-fat diet Continue Rosuvastatin, Fish Oil and Ezetimibe

## 2021-07-09 NOTE — Progress Notes (Signed)
HPI:  Patient presents the clinic today for his subsequent annual Medicare wellness exam.  He is also due to follow-up chronic conditions.  HTN: His BP today is 126/74.  He is taking Losartan as prescribed.  There is no ECG on file.  HLD: His last LDL was 40, triglycerides 411, 06/2019.  He denies myalgias on Rosuvastatin, Fish Oil and Ezetimibe.  He does not consume a low-fat diet.  ED: He has difficulty maintaining erection.  He takes Sildenafil as needed with good results.  He does not follow with urology.  COPD: He denies chronic cough or shortness of breath.  He is not using any inhalers at this time.  He does not smoke.  There are no PFTs on file.  Past Medical History:  Diagnosis Date   History of chicken pox     Current Outpatient Medications  Medication Sig Dispense Refill   Ascorbic Acid (VITAMIN C) 1000 MG tablet Take 1,000 mg by mouth daily.     ezetimibe (ZETIA) 10 MG tablet TAKE 1 TABLET EVERY DAY 90 tablet 0   losartan (COZAAR) 100 MG tablet TAKE 1 TABLET EVERY DAY 90 tablet 0   Multiple Vitamins-Minerals (CENTRUM ADULTS PO) Take 1 capsule by mouth daily.     Omega-3 Fatty Acids (FISH OIL) 1000 MG CAPS Take 1,000 mg by mouth daily.      rosuvastatin (CRESTOR) 10 MG tablet TAKE 1 TABLET EVERY DAY 90 tablet 0   sildenafil (VIAGRA) 50 MG tablet Take 0.5 tablets (25 mg total) by mouth daily as needed for erectile dysfunction. 30 tablet 2   No current facility-administered medications for this visit.    No Known Allergies  Family History  Problem Relation Age of Onset   Hyperlipidemia Sister    Heart disease Brother        heart transplant   Kidney cancer Brother    Depression Neg Hx    Stroke Neg Hx     Social History   Socioeconomic History   Marital status: Divorced    Spouse name: Not on file   Number of children: Not on file   Years of education: Not on file   Highest education level: Not on file  Occupational History   Not on file  Tobacco Use    Smoking status: Former    Types: Cigarettes   Smokeless tobacco: Never  Substance and Sexual Activity   Alcohol use: Yes    Alcohol/week: 0.0 standard drinks    Comment: occasional   Drug use: No   Sexual activity: Yes  Other Topics Concern   Not on file  Social History Narrative   Not on file   Social Determinants of Health   Financial Resource Strain: Not on file  Food Insecurity: Not on file  Transportation Needs: Not on file  Physical Activity: Not on file  Stress: Not on file  Social Connections: Not on file  Intimate Partner Violence: Not on file    Hospitiliaztions: None  Health Maintenance:    Flu: 09/2019  Tetanus: 04/2015  Pneumovax: 11/2016  Prevnar: 04/2015  Shingrix: Never COVID: Pfizer x3  PSA: 06/2020  Colon Screening: Cologuard 06/2020  Eye Doctor: annually  Dental Exam: annually   Providers:   PCP: Webb Silversmith, NP   I have personally reviewed and have noted:  1. The patient's medical and social history 2. Their use of alcohol, tobacco or illicit drugs 3. Their current medications and supplements 4. The patient's functional ability including ADL's, fall risks,  home safety risks and hearing or visual impairment. 5. Diet and physical activities 6. Evidence for depression or mood disorder  Subjective:   Review of Systems:   Constitutional: Denies fever, malaise, fatigue, headache or abrupt weight changes.  HEENT: Denies eye pain, eye redness, ear pain, ringing in the ears, wax buildup, runny nose, nasal congestion, bloody nose, or sore throat. Respiratory: Denies difficulty breathing, shortness of breath, cough or sputum production.   Cardiovascular: Denies chest pain, chest tightness, palpitations or swelling in the hands or feet.  Gastrointestinal: Denies abdominal pain, bloating, constipation, diarrhea or blood in the stool.  GU: Patient reports erectile dysfunction.  Denies urgency, frequency, pain with urination, burning sensation, blood in  urine, odor or discharge. Musculoskeletal: Denies decrease in range of motion, difficulty with gait, muscle pain or joint pain and swelling.  Skin: Denies redness, rashes, lesions or ulcercations.  Neurological: Denies dizziness, difficulty with memory, difficulty with speech or problems with balance and coordination.  Psych: Denies anxiety, depression, SI/HI.  No other specific complaints in a complete review of systems (except as listed in HPI above).  Objective:  PE:   BP 126/74 (BP Location: Right Arm, Patient Position: Sitting, Cuff Size: Normal)   Pulse 88   Temp 97.8 F (36.6 C) (Temporal)   Resp 17   Ht _0  (1.702 m)   Wt 180 lb (81.6 kg)   SpO2 98%   BMI 28.19 kg/m   Wt Readings from Last 3 Encounters:  06/23/20 202 lb (91.6 kg)  06/08/19 200 lb (90.7 kg)  01/02/18 189 lb (85.7 kg)    General: Appears his stated age, overweight, in NAD. Skin: Warm, dry and intact.  HEENT: Head: normal shape and size; Eyes: sclera white and EOMs intact; Neck: Neck supple, trachea midline. No masses, lumps or thyromegaly present.  Cardiovascular: Normal rate and rhythm. S1,S2 noted.  No murmur, rubs or gallops noted. No JVD or BLE edema. No carotid bruits noted. Pulmonary/Chest: Normal effort and positive vesicular breath sounds. No respiratory distress. No wheezes, rales or ronchi noted.  Abdomen: Soft and nontender. Normal bowel sounds.  Ventral and abdominal wall hernia masses noted. Liver, spleen and kidneys non palpable. Musculoskeletal: Strength 5/5 BUE/BLE.  No difficulty with gait. Neurological: Alert and oriented. Cranial nerves II-XII grossly intact. Coordination normal.  Psychiatric: Mood and affect normal. Behavior is normal. Judgment and thought content normal.     BMET    Component Value Date/Time   NA 137 06/23/2020 1109   K 4.6 06/23/2020 1109   CL 101 06/23/2020 1109   CO2 24 06/23/2020 1109   GLUCOSE 181 (H) 06/23/2020 1109   BUN 15 06/23/2020 1109    CREATININE 1.17 06/23/2020 1109   CREATININE 1.19 06/08/2019 1515   CALCIUM 9.7 06/23/2020 1109    Lipid Panel     Component Value Date/Time   CHOL 126 06/23/2020 1109   TRIG (H) 06/23/2020 1109    411.0 Triglyceride is over 400; calculations on Lipids are invalid.   HDL 39.30 06/23/2020 1109   CHOLHDL 3 06/23/2020 1109   VLDL 69.6 (H) 09/14/2019 1353   [place]  06/08/2019 1515     Comment:     . LDL cholesterol not calculated. Triglyceride levels greater than 400 mg/dL invalidate calculated LDL results. . Reference range: <100 . Desirable range <100 mg/dL for primary prevention;   <70 mg/dL for patients with CHD or diabetic patients  with > or = 2 CHD risk factors. Marland Kitchen LDL-C is now calculated using  the Martin-Hopkins  calculation, which is a validated novel method providing  better accuracy than the Friedewald equation in the  estimation of LDL-C.  Cresenciano Genre et al. Annamaria Helling. 8406;986(14): 2061-2068  (http://education.[facility].com/faq/FAQ164)     CBC    Component Value Date/Time   WBC 11.0 (H) 06/23/2020 1109   RBC 5.01 06/23/2020 1109   HGB 15.1 06/23/2020 1109   HCT 44.3 06/23/2020 1109   PLT 242.0 06/23/2020 1109   MCV 88.5 06/23/2020 1109   MCH 29.9 06/08/2019 1515   MCHC 34.0 06/23/2020 1109   RDW 14.0 06/23/2020 1109       Assessment and Plan:   Medicare Annual Wellness Visit:  Diet: He does eat some meat. He consumes fruits and veggies. He tries to avoid fried foods. He drinks mostly water. Physical activity: Walking Depression/mood screen: Negative, PHQ 9 score of 0 Hearing: Intact to whispered voice Visual acuity: Grossly normal, performs annual eye exam  ADLs: Capable Fall risk: None Home safety: Good Cognitive evaluation: Intact to orientation, naming, recall and repetition EOL planning: Adv directives, full code/ I agree  Preventative Medicine: Encouraged him to get a flu shot in the fall.  Tetanus, Pneumovax and Prevnar UTD.  He will  consider Shingrix vaccine, will get this at the pharmacy if he would like to get this.  Encouraged him to get his COVID booster.  Cologuard UTD encouraged him to consume a balanced diet and exercise regimen.  Advised him to see an eye doctor and dentist annually.  Will check CBC, c-Met, lipid and PSA today.  Due dates for screening exam given to patient as part of his AVS.   Next appointment: 1 year, Medicare Wellness Exam   Webb Silversmith, NP This visit occurred during the SARS-CoV-2 public health emergency.  Safety protocols were in place, including screening questions prior to the visit, additional usage of staff PPE, and extensive cleaning of exam room while observing appropriate contact time as indicated for disinfecting solutions.

## 2021-07-09 NOTE — Assessment & Plan Note (Signed)
Encourage diet and exercise for weight loss 

## 2021-07-10 ENCOUNTER — Telehealth: Payer: Self-pay

## 2021-07-10 ENCOUNTER — Ambulatory Visit (INDEPENDENT_AMBULATORY_CARE_PROVIDER_SITE_OTHER): Payer: Medicare HMO

## 2021-07-10 DIAGNOSIS — R7309 Other abnormal glucose: Secondary | ICD-10-CM

## 2021-07-10 LAB — COMPLETE METABOLIC PANEL WITH GFR
AG Ratio: 1.6 (calc) (ref 1.0–2.5)
ALT: 36 U/L (ref 9–46)
AST: 26 U/L (ref 10–35)
Albumin: 4.4 g/dL (ref 3.6–5.1)
Alkaline phosphatase (APISO): 97 U/L (ref 35–144)
BUN: 17 mg/dL (ref 7–25)
CO2: 25 mmol/L (ref 20–32)
Calcium: 9.6 mg/dL (ref 8.6–10.3)
Chloride: 94 mmol/L — ABNORMAL LOW (ref 98–110)
Creat: 1.04 mg/dL (ref 0.70–1.28)
Globulin: 2.8 g/dL (calc) (ref 1.9–3.7)
Glucose, Bld: 509 mg/dL (ref 65–139)
Potassium: 4.8 mmol/L (ref 3.5–5.3)
Sodium: 130 mmol/L — ABNORMAL LOW (ref 135–146)
Total Bilirubin: 1.5 mg/dL — ABNORMAL HIGH (ref 0.2–1.2)
Total Protein: 7.2 g/dL (ref 6.1–8.1)
eGFR: 77 mL/min/{1.73_m2} (ref >=60)

## 2021-07-10 LAB — LIPID PANEL
Cholesterol: 108 mg/dL (ref <200)
HDL: 38 mg/dL — ABNORMAL LOW (ref >=40)
LDL Cholesterol (Calc): 28 mg/dL (calc)
Non-HDL Cholesterol (Calc): 70 mg/dL (calc) (ref <130)
Total CHOL/HDL Ratio: 2.8 (calc) (ref <5.0)
Triglycerides: 399 mg/dL — ABNORMAL HIGH (ref <150)

## 2021-07-10 LAB — CBC
HCT: 49.6 % (ref 38.5–50.0)
Hemoglobin: 16.2 g/dL (ref 13.2–17.1)
MCH: 29.7 pg (ref 27.0–33.0)
MCHC: 32.7 g/dL (ref 32.0–36.0)
MCV: 90.8 fL (ref 80.0–100.0)
MPV: 11.7 fL (ref 7.5–12.5)
Platelets: 263 10*3/uL (ref 140–400)
RBC: 5.46 10*6/uL (ref 4.20–5.80)
RDW: 12.6 % (ref 11.0–15.0)
WBC: 11 10*3/uL — ABNORMAL HIGH (ref 3.8–10.8)

## 2021-07-10 LAB — POCT GLYCOSYLATED HEMOGLOBIN (HGB A1C): Hemoglobin A1C: 14 % — AB (ref 4.0–5.6)

## 2021-07-10 LAB — PSA: PSA: 0.94 ng/mL (ref <=4.00)

## 2021-07-10 NOTE — Telephone Encounter (Signed)
Brand Males, Barista with [employer] called critical lab result collected on 07/09/21. Glucose 509 verified by repeat analysis.  Result read back and verified, advised I will send this to the provider. Called the office and spoke Rachell, FC to advise it will be coming over.

## 2021-07-13 ENCOUNTER — Encounter: Payer: Self-pay | Admitting: Internal Medicine

## 2021-07-13 ENCOUNTER — Other Ambulatory Visit: Payer: Self-pay

## 2021-07-13 ENCOUNTER — Ambulatory Visit (INDEPENDENT_AMBULATORY_CARE_PROVIDER_SITE_OTHER): Payer: Medicare HMO | Admitting: Internal Medicine

## 2021-07-13 VITALS — BP 143/77 | HR 93 | Temp 97.7°F | Resp 17 | Ht 67.0 in | Wt 181.2 lb

## 2021-07-13 DIAGNOSIS — E1165 Type 2 diabetes mellitus with hyperglycemia: Secondary | ICD-10-CM

## 2021-07-13 DIAGNOSIS — Z6828 Body mass index (BMI) 28.0-28.9, adult: Secondary | ICD-10-CM | POA: Diagnosis not present

## 2021-07-13 DIAGNOSIS — E663 Overweight: Secondary | ICD-10-CM | POA: Diagnosis not present

## 2021-07-13 DIAGNOSIS — E119 Type 2 diabetes mellitus without complications: Secondary | ICD-10-CM

## 2021-07-13 MED ORDER — BLOOD GLUCOSE METER KIT: PACK | 0 refills | Status: AC

## 2021-07-13 MED ORDER — METFORMIN HCL 1000 MG PO TABS: ORAL_TABLET | ORAL | 0 refills | Status: DC

## 2021-07-13 NOTE — Progress Notes (Signed)
Subjective:    Patient ID: Charles Smith, male    DOB: [birth year], 70 y.o.   MRN: [id number]  HPI  Patient presents to clinic today for follow-up labs.  His recent A1c was greater than 14%.  He has never been told that he is diabetic in the past.  He reports he does not have a family history of diabetes.  He denies visual changes, increased thirst, nonhealing wounds or numbness or tingling in his hands and his feet.  He does report some urinary frequency.  Review of Systems     Past Medical History:  Diagnosis Date   History of chicken pox     Current Outpatient Medications  Medication Sig Dispense Refill   Ascorbic Acid (VITAMIN C) 1000 MG tablet Take 1,000 mg by mouth daily.     ezetimibe (ZETIA) 10 MG tablet TAKE 1 TABLET EVERY DAY 90 tablet 0   losartan (COZAAR) 100 MG tablet TAKE 1 TABLET EVERY DAY 90 tablet 0   Multiple Vitamins-Minerals (CENTRUM ADULTS PO) Take 1 capsule by mouth daily.     Omega-3 Fatty Acids (FISH OIL) 1000 MG CAPS Take 1,000 mg by mouth daily.      rosuvastatin (CRESTOR) 10 MG tablet TAKE 1 TABLET EVERY DAY 90 tablet 0   sildenafil (VIAGRA) 50 MG tablet Take 0.5 tablets (25 mg total) by mouth daily as needed for erectile dysfunction. 30 tablet 2   No current facility-administered medications for this visit.    No Known Allergies  Family History  Problem Relation Age of Onset   Hyperlipidemia Sister    Heart disease Brother        heart transplant   Kidney cancer Brother    Depression Neg Hx    Stroke Neg Hx     Social History   Socioeconomic History   Marital status: Divorced    Spouse name: Not on file   Number of children: Not on file   Years of education: Not on file   Highest education level: Not on file  Occupational History   Not on file  Tobacco Use   Smoking status: Former    Types: Cigarettes   Smokeless tobacco: Never  Vaping Use   Vaping Use: Never used  Substance and Sexual Activity   Alcohol use: Yes     Alcohol/week: 0.0 standard drinks    Comment: occasional   Drug use: No   Sexual activity: Yes  Other Topics Concern   Not on file  Social History Narrative   Not on file   Social Determinants of Health   Financial Resource Strain: Not on file  Food Insecurity: Not on file  Transportation Needs: Not on file  Physical Activity: Not on file  Stress: Not on file  Social Connections: Not on file  Intimate Partner Violence: Not on file     Constitutional: Denies fever, malaise, fatigue, headache or abrupt weight changes.  HEENT: Denies eye pain, eye redness, ear pain, ringing in the ears, wax buildup, runny nose, nasal congestion, bloody nose, or sore throat. Respiratory: Denies difficulty breathing, shortness of breath, cough or sputum production.   Cardiovascular: Denies chest pain, chest tightness, palpitations or swelling in the hands or feet.  Gastrointestinal: Denies abdominal pain, bloating, constipation, diarrhea or blood in the stool.  GU: Patient reports urinary frequency.  Denies urgency, pain with urination, burning sensation, blood in urine, odor or discharge. Musculoskeletal: Denies decrease in range of motion, difficulty with gait, muscle pain or joint pain  and swelling.  Skin: Denies redness, rashes, lesions or ulcercations.  Neurological: Denies dizziness, difficulty with memory, difficulty with speech or problems with balance and coordination.  Psych: Denies anxiety, depression, SI/HI.  No other specific complaints in a complete review of systems (except as listed in HPI above).  Objective:   Physical Exam   BP (!) 143/77 (BP Location: Right Arm, Patient Position: Sitting, Cuff Size: Normal)   Pulse 93   Temp 97.7 F (36.5 C) (Temporal)   Resp 17   Ht '5\' 7"'$  (1.702 m)   Wt 181 lb 3.2 oz (82.2 kg)   SpO2 100%   BMI 28.38 kg/m   Wt Readings from Last 3 Encounters:  07/09/21 180 lb (81.6 kg)  06/23/20 202 lb (91.6 kg)  06/08/19 200 lb (90.7 kg)     General: Appears his stated age, overweight, in NAD. Skin: Warm, dry and intact. No ulcerations noted. HEENT: Head: normal shape and size;  Cardiovascular: Normal rate. Pulmonary/Chest: Normal effort. Neurological: Alert and oriented.    BMET    Component Value Date/Time   NA 130 (L) 07/09/2021 1452   K 4.8 07/09/2021 1452   CL 94 (L) 07/09/2021 1452   CO2 25 07/09/2021 1452   GLUCOSE 509 (HH) 07/09/2021 1452   BUN 17 07/09/2021 1452   CREATININE 1.04 07/09/2021 1452   CALCIUM 9.6 07/09/2021 1452    Lipid Panel     Component Value Date/Time   CHOL 108 07/09/2021 1452   TRIG 399 (H) 07/09/2021 1452   HDL 38 (L) 07/09/2021 1452   CHOLHDL 2.8 07/09/2021 1452   VLDL 69.6 (H) 09/14/2019 1353   LDLCALC 28 07/09/2021 1452    CBC    Component Value Date/Time   WBC 11.0 (H) 07/09/2021 1452   RBC 5.46 07/09/2021 1452   HGB 16.2 07/09/2021 1452   HCT 49.6 07/09/2021 1452   PLT 263 07/09/2021 1452   MCV 90.8 07/09/2021 1452   MCH 29.7 07/09/2021 1452   MCHC 32.7 07/09/2021 1452   RDW 12.6 07/09/2021 1452    Hgb A1C Lab Results  Component Value Date   HGBA1C 14.0 (A) 07/10/2021           Assessment & Plan:   Webb Silversmith, NP This visit occurred during the SARS-CoV-2 public health emergency.  Safety protocols were in place, including screening questions prior to the visit, additional usage of staff PPE, and extensive cleaning of exam room while observing appropriate contact time as indicated for disinfecting solutions.

## 2021-07-13 NOTE — Patient Instructions (Signed)
Diabetes Mellitus and Standards of Medical Care Living with and managing diabetes (diabetes mellitus) can be complicated. Your diabetes treatment may be managed by a team of health care providers, including: A physician who specializes in diabetes (endocrinologist). You might also have visits with a nurse practitioner or physician assistant. Nurses. A registered dietitian. A certified diabetes care and education specialist. An exercise specialist. A pharmacist. An eye doctor. A foot specialist (podiatrist). A dental care provider. A primary care provider. A mental health care provider. How to manage your diabetes You can do many things to successfully manage your diabetes. Your health care providers will follow guidelines to help you get the best quality of care. Here are general guidelines for your diabetes management plan. Your health care providers may give you more specific instructions. Physical exams When you are diagnosed with diabetes, and each year after that, your health care provider will ask about your medical and family history. You will have a physical exam, which may include: Measuring your height, weight, and body mass index (BMI). Checking your blood pressure. This will be done at every routine medical visit. Your target blood pressure may vary depending on your medical conditions, your age, and other factors. A thyroid exam. A skin exam. Screening for nerve damage (peripheral neuropathy). This may include checking the pulse in your legs and feet and the level of sensation in your hands and feet. A foot exam to inspect the structure and skin of your feet, including checking for cuts, bruises, redness, blisters, sores, or other problems. Screening for blood vessel (vascular) problems. This may include checking the pulse in your legs and feet and checking your temperature. Blood tests Depending on your treatment plan and your personal needs, you may have the following  tests: Hemoglobin A1C (HbA1C). This test provides information about blood sugar (glucose) control over the previous 2-3 months. It is used to adjust your treatment plan, if needed. This test will be done: At least 2 times a year, if you are meeting your treatment goals. 4 times a year, if you are not meeting your treatment goals or if your goals have changed. Lipid testing, including total cholesterol, LDL and HDL cholesterol, and triglyceride levels. The goal for LDL is less than 100 mg/dL (5.5 mmol/L). If you are at high risk for complications, the goal is less than 70 mg/dL (3.9 mmol/L). The goal for HDL is 40 mg/dL (2.2 mmol/L) or higher for men, and 50 mg/dL (2.8 mmol/L) or higher for women. An HDL cholesterol of 60 mg/dL (3.3 mmol/L) or higher gives some protection against heart disease. The goal for triglycerides is less than 150 mg/dL (8.3 mmol/L). Liver function tests. Kidney function tests. Thyroid function tests.  Dental and eye exams  Visit your dentist two times a year. If you have type 1 diabetes, your health care provider may recommend an eye exam within 5 years after you are diagnosed, and then once a year after your first exam. For children with type 1 diabetes, the health care provider may recommend an eye exam when your child is age 11 or older and has had diabetes for 3-5 years. After the first exam, your child should get an eye exam once a year. If you have type 2 diabetes, your health care provider may recommend an eye exam as soon as you are diagnosed, and then every 1-2 years after your first exam. Immunizations A yearly flu (influenza) vaccine is recommended annually for everyone 6 months or older. This is especially   important if you have diabetes. The pneumonia (pneumococcal) vaccine is recommended for everyone 2 years or older who has diabetes. If you are age 64 or older, you may get the pneumonia vaccine as a series of two separate shots. The hepatitis B vaccine is  recommended for adults shortly after being diagnosed with diabetes. Adults and children with diabetes should receive all other vaccines according to age-specific recommendations from the Centers for Disease Control andPrevention (CDC). Mental and emotional health Screening for symptoms of eating disorders, anxiety, and depression is recommended at the time of diagnosis and after as needed. If your screening shows that you have symptoms, you may need more evaluation. You may work with [employer] health care provider. Follow these instructions at home: Treatment plan You will monitor your blood glucose levels and may give yourself insulin. Your treatment plan will be reviewed at every medical visit. You and your health care provider will discuss: How you are taking your medicines, including insulin. Any side effects you have. Your blood glucose level target goals. How often you monitor your blood glucose level. Lifestyle habits, such as activity level and tobacco, alcohol, and substance use. Education Your health care provider will assess how well you are monitoring your blood glucose levels and whether you are taking your insulin and medicines correctly. He or she may refer you to: A certified diabetes care and education specialist to manage your diabetes throughout your life, starting at diagnosis. A registered dietitian who can create and review your personal nutrition plan. An exercise specialist who can discuss your activity level and exercise plan. General instructions Take over-the-counter and prescription medicines only as told by your health care provider. Keep all follow-up visits. This is important. Where to find support There are many diabetes support networks, including: American Diabetes Association (ADA): diabetes.org Defeat Diabetes Foundation: defeatdiabetes.org Where to find more information American Diabetes Association (ADA): www.diabetes.org Association of Diabetes Care &  Education Specialists (ADCES): diabeteseducator.org International Diabetes Federation (IDF): https://www.munoz-bell.org/ Summary Managing diabetes (diabetes mellitus) can be complicated. Your diabetes treatment may be managed by a team of health care providers. Your health care providers follow guidelines to help you get the best quality care. You should have physical exams, blood tests, blood pressure monitoring, immunizations, and screening tests regularly. Stay updated on how to manage your diabetes. Your health care providers may also give you more specific instructions based on your individual health. This information is not intended to replace advice given to you by your health care provider. Make sure you discuss any questions you have with your healthcare provider. Document Revised: 05/08/2020 Document Reviewed: 05/08/2020 Elsevier Patient Education  [place].   Diabetes Mellitus and Nutrition, Adult When you have diabetes, or diabetes mellitus, it is very important to have healthy eating habits because your blood sugar (glucose) levels are greatly affected by what you eat and drink. Eating healthy foods in the right amounts, at about the same times every day, can help you: Control your blood glucose. Lower your risk of heart disease. Improve your blood pressure. Reach or maintain a healthy weight. What can affect my meal plan? Every person with diabetes is different, and each person has different needs for a meal plan. Your health care provider may recommend that you work with a dietitian to make a meal plan that is best for you. Your meal plan may vary depending on factors such as: The calories you need. The medicines you take. Your weight. Your blood glucose, blood pressure, and cholesterol  levels. Your activity level. Other health conditions you have, such as heart or kidney disease. How do carbohydrates affect me? Carbohydrates, also called carbs, affect your blood glucose level more  than any other type of food. Eating carbs naturally raises the amount of glucose in your blood. Carb counting is a method for keeping track of how many carbs you eat. Counting carbs is important to keep your blood glucose at a healthy level,especially if you use insulin or take certain oral diabetes medicines. It is important to know how many carbs you can safely have in each meal. This is different for every person. Your dietitian can help you calculate how manycarbs you should have at each meal and for each snack. How does alcohol affect me? Alcohol can cause a sudden decrease in blood glucose (hypoglycemia), especially if you use insulin or take certain oral diabetes medicines. Hypoglycemia can be a life-threatening condition. Symptoms of hypoglycemia, such as sleepiness, dizziness, and confusion, are similar to symptoms of having too much alcohol. Do not drink alcohol if: Your health care provider tells you not to drink. You are pregnant, may be pregnant, or are planning to become pregnant. If you drink alcohol: Do not drink on an empty stomach. Limit how much you use to: 0-1 drink a day for women. 0-2 drinks a day for men. Be aware of how much alcohol is in your drink. In the [place] one drink equals one 12 oz bottle of beer (355 mL), one 5 oz glass of wine (148 mL), or one 1 oz glass of hard liquor (44 mL). Keep yourself hydrated with water, diet soda, or unsweetened iced tea. Keep in mind that regular soda, juice, and other mixers may contain a lot of sugar and must be counted as carbs. What are tips for following this plan?  Reading food labels Start by checking the serving size on the "Nutrition Facts" label of packaged foods and drinks. The amount of calories, carbs, fats, and other nutrients listed on the label is based on one serving of the item. Many items contain more than one serving per package. Check the total grams (g) of carbs in one serving. You can calculate the number of  servings of carbs in one serving by dividing the total carbs by 15. For example, if a food has 30 g of total carbs per serving, it would be equal to 2 servings of carbs. Check the number of grams (g) of saturated fats and trans fats in one serving. Choose foods that have a low amount or none of these fats. Check the number of milligrams (mg) of salt (sodium) in one serving. Most people should limit total sodium intake to less than 2,300 mg per day. Always check the nutrition information of foods labeled as "low-fat" or "nonfat." These foods may be higher in added sugar or refined carbs and should be avoided. Talk to your dietitian to identify your daily goals for nutrients listed on the label. Shopping Avoid buying canned, pre-made, or processed foods. These foods tend to be high in fat, sodium, and added sugar. Shop around the outside edge of the grocery store. This is where you will most often find fresh fruits and vegetables, bulk grains, fresh meats, and fresh dairy. Cooking Use low-heat cooking methods, such as baking, instead of high-heat cooking methods like deep frying. Cook using healthy oils, such as olive, canola, or sunflower oil. Avoid cooking with butter, cream, or high-fat meats. Meal planning Eat meals and snacks regularly, preferably at  the same times every day. Avoid going long periods of time without eating. Eat foods that are high in fiber, such as fresh fruits, vegetables, beans, and whole grains. Talk with your dietitian about how many servings of carbs you can eat at each meal. Eat 4-6 oz (112-168 g) of lean protein each day, such as lean meat, chicken, fish, eggs, or tofu. One ounce (oz) of lean protein is equal to: 1 oz (28 g) of meat, chicken, or fish. 1 egg.  cup (62 g) of tofu. Eat some foods each day that contain healthy fats, such as avocado, nuts, seeds, and fish. What foods should I eat? Fruits Berries. Apples. Oranges. Peaches. Apricots. Plums. Grapes. Mango.  Papaya.Pomegranate. Kiwi. Cherries. Vegetables Lettuce. Spinach. Leafy greens, including kale, chard, collard greens, and mustard greens. Beets. Cauliflower. Cabbage. Broccoli. Carrots. Green beans.Tomatoes. Peppers. Onions. Cucumbers. Brussels sprouts. Grains Whole grains, such as whole-wheat or whole-grain bread, crackers, tortillas,cereal, and pasta. Unsweetened oatmeal. Quinoa. Brown or wild rice. Meats and other proteins Seafood. Poultry without skin. Lean cuts of poultry and beef. Tofu. Nuts. Seeds. Dairy Low-fat or fat-free dairy products such as milk, yogurt, and cheese. The items listed above may not be a complete list of foods and beverages you can eat. Contact a dietitian for more information. What foods should I avoid? Fruits Fruits canned with syrup. Vegetables Canned vegetables. Frozen vegetables with butter or cream sauce. Grains Refined white flour and flour products such as bread, pasta, snack foods, andcereals. Avoid all processed foods. Meats and other proteins Fatty cuts of meat. Poultry with skin. Breaded or fried meats. Processed meat.Avoid saturated fats. Dairy Full-fat yogurt, cheese, or milk. Beverages Sweetened drinks, such as soda or iced tea. The items listed above may not be a complete list of foods and beverages you should avoid. Contact a dietitian for more information. Questions to ask a health care provider Do I need to meet with a diabetes educator? Do I need to meet with a dietitian? What number can I call if I have questions? When are the best times to check my blood glucose? Where to find more information: American Diabetes Association: diabetes.org Academy of Nutrition and Dietetics: www.eatright.[employer] of Diabetes and Digestive and Kidney Diseases: [place] Association of Diabetes Care and Education Specialists: www.diabeteseducator.org Summary It is important to have healthy eating habits because your blood sugar  (glucose) levels are greatly affected by what you eat and drink. A healthy meal plan will help you control your blood glucose and maintain a healthy lifestyle. Your health care provider may recommend that you work with a dietitian to make a meal plan that is best for you. Keep in mind that carbohydrates (carbs) and alcohol have immediate effects on your blood glucose levels. It is important to count carbs and to use alcohol carefully. This information is not intended to replace advice given to you by your health care provider. Make sure you discuss any questions you have with your healthcare provider. Document Revised: 10/09/2019 Document Reviewed: 10/09/2019 Elsevier Patient Education  2021 [employer].

## 2021-07-14 DIAGNOSIS — E1165 Type 2 diabetes mellitus with hyperglycemia: Secondary | ICD-10-CM | POA: Insufficient documentation

## 2021-07-14 NOTE — Assessment & Plan Note (Signed)
Encourage low-carb diet and exercise weight loss

## 2021-07-14 NOTE — Assessment & Plan Note (Signed)
New onset Discussed diabetes and standards of medical care Encourage low-carb diet and exercise weight loss Will start Metformin 1000 mg twice daily Will need annual diabetic eye exam Will need routine foot exams We will check urine microalbumin at his next visit He declines referral to diabetes education and nutrition at this time Will send in a meter, strips and lancets We will discuss immunizations at next visit  RTC in 3 months for follow-up chronic conditions

## 2021-08-06 ENCOUNTER — Encounter: Payer: Self-pay | Admitting: Internal Medicine

## 2021-08-13 ENCOUNTER — Encounter: Payer: Self-pay | Admitting: Internal Medicine

## 2021-08-19 ENCOUNTER — Encounter: Payer: Self-pay | Admitting: Internal Medicine

## 2021-08-24 ENCOUNTER — Telehealth: Payer: Self-pay | Admitting: Internal Medicine

## 2021-08-24 NOTE — Telephone Encounter (Signed)
[name] with [facility] called in says faxed med refill for metformin on oct 3, but I dont see anything in the system for this. Please call back if you received this.

## 2021-08-26 MED ORDER — METFORMIN HCL 1000 MG PO TABS: 1000.0000 mg | ORAL_TABLET | Freq: Two times a day (BID) | ORAL | 3 refills | Status: DC

## 2021-09-02 ENCOUNTER — Encounter: Payer: Self-pay | Admitting: Internal Medicine

## 2021-09-23 DIAGNOSIS — E119 Type 2 diabetes mellitus without complications: Secondary | ICD-10-CM | POA: Diagnosis not present

## 2021-10-01 ENCOUNTER — Other Ambulatory Visit: Payer: Self-pay | Admitting: Internal Medicine

## 2021-10-02 NOTE — Telephone Encounter (Signed)
Requested Prescriptions  Pending Prescriptions Disp Refills  . ezetimibe (ZETIA) 10 MG tablet [Pharmacy Med Name: EZETIMIBE 10 MG Tablet] 90 tablet 0    Sig: TAKE 1 TABLET EVERY DAY     Cardiovascular:  Antilipid - Sterol Transport Inhibitors Failed - 10/01/2021 10:46 PM      Failed - HDL in normal range and within 360 days    HDL  Date Value Ref Range Status  07/09/2021 38 (L) > OR = 40 mg/dL Final         Failed - Triglycerides in normal range and within 360 days    Triglycerides  Date Value Ref Range Status  07/09/2021 399 (H) <150 mg/dL Final    Comment:    . If a non-fasting specimen was collected, consider repeat triglyceride testing on a fasting specimen if clinically indicated.  Yates Decamp et al. J. of Clin. Lipidol. 7824;2:353-614. Marland Kitchen          Passed - Total Cholesterol in normal range and within 360 days    Cholesterol  Date Value Ref Range Status  07/09/2021 108 <200 mg/dL Final         Passed - LDL in normal range and within 360 days    LDL Cholesterol (Calc)  Date Value Ref Range Status  07/09/2021 28 mg/dL (calc) Final    Comment:    Reference range: <100 . Desirable range <100 mg/dL for primary prevention;   <70 mg/dL for patients with CHD or diabetic patients  with > or = 2 CHD risk factors. Marland Kitchen LDL-C is now calculated using the Martin-Hopkins  calculation, which is a validated novel method providing  better accuracy than the Friedewald equation in the  estimation of LDL-C.  Cresenciano Genre et al. Annamaria Helling. 4315;400(86): 2061-2068  (http://education.[facility].com/faq/FAQ164)    Direct LDL  Date Value Ref Range Status  06/23/2020 40.0 mg/dL Final    Comment:    Optimal:  <100 mg/dLNear or Above Optimal:  100-129 mg/dLBorderline High:  130-159 mg/dLHigh:  160-189 mg/dLVery High:  >190 mg/dL         Passed - Valid encounter within last 12 months    Recent Outpatient Visits          2 months ago Type 2 diabetes mellitus with hyperglycemia, without  long-term current use of insulin [facility])   [facility] [place], Coralie Keens, NP   2 months ago Medicare annual wellness visit, subsequent   [facility] [place], Coralie Keens, NP

## 2021-10-08 ENCOUNTER — Other Ambulatory Visit: Payer: Self-pay | Admitting: Internal Medicine

## 2021-10-08 NOTE — Telephone Encounter (Signed)
Rx refilled 08/26/2021 #190 with 3 refills.

## 2021-10-09 NOTE — Telephone Encounter (Signed)
Requested Prescriptions  Pending Prescriptions Disp Refills  . metFORMIN (GLUCOPHAGE) 1000 MG tablet [Pharmacy Med Name: METFORMIN HCL 1,000 MG TABLET] 180 tablet 3    Sig: TAKE 1/2 TAB DAILY X7 DAYS, 1/2 TAB TWICE DAILY WITH MEALS X7 DAYS, THEN 1 TAB TWICE DAILY WITH MEAL     Endocrinology:  Diabetes - Biguanides Failed - 10/08/2021  1:27 AM      Failed - HBA1C is between 0 and 7.9 and within 180 days    Hemoglobin A1C  Date Value Ref Range Status  07/10/2021 14.0 (A) 4.0 - 5.6 % Final    Comment:    greater 14.0%         Passed - Cr in normal range and within 360 days    Creat  Date Value Ref Range Status  07/09/2021 1.04 0.70 - 1.28 mg/dL Final         Passed - eGFR in normal range and within 360 days    GFR  Date Value Ref Range Status  06/23/2020 61.60 >60.00 mL/min Final   eGFR  Date Value Ref Range Status  07/09/2021 77 > OR = 60 mL/min/1.28m Final    Comment:    The eGFR is based on the CKD-EPI 2021 equation. To calculate  the new eGFR from a previous Creatinine or Cystatin C result, go to https://www.kidney.org/professionals/ kdoqi/gfr%5Fcalculator          Passed - Valid encounter within last 6 months    Recent Outpatient Visits          2 months ago Type 2 diabetes mellitus with hyperglycemia, without long-term current use of insulin ([name]   [name]   3 months ago Medicare annual wellness visit, subsequent   [name]

## 2021-11-18 LAB — HM DIABETES EYE EXAM

## 2021-12-07 ENCOUNTER — Other Ambulatory Visit: Payer: Self-pay | Admitting: Internal Medicine

## 2021-12-07 NOTE — Telephone Encounter (Signed)
Requested Prescriptions  Pending Prescriptions Disp Refills   losartan (COZAAR) 100 MG tablet [Pharmacy Med Name: LOSARTAN POTASSIUM 100 MG Tablet] 90 tablet 0    Sig: TAKE 1 TABLET EVERY DAY     Cardiovascular:  Angiotensin Receptor Blockers Failed - 12/07/2021  5:07 AM      Failed - Last BP in normal range    BP Readings from Last 1 Encounters:  07/13/21 (!) 143/77         Passed - Cr in normal range and within 180 days    Creat  Date Value Ref Range Status  07/09/2021 1.04 0.70 - 1.28 mg/dL Final         Passed - K in normal range and within 180 days    Potassium  Date Value Ref Range Status  07/09/2021 4.8 3.5 - 5.3 mmol/L Final         Passed - Patient is not pregnant      Passed - Valid encounter within last 6 months    Recent Outpatient Visits          4 months ago Type 2 diabetes mellitus with hyperglycemia, without long-term current use of insulin ([place])   [place], NP   5 months ago Medicare annual wellness visit, subsequent   [facility] [place], Coralie Keens, [place]

## 2021-12-14 ENCOUNTER — Other Ambulatory Visit: Payer: Self-pay | Admitting: Internal Medicine

## 2021-12-14 NOTE — Telephone Encounter (Signed)
Requested medication (s) are due for refill today:   Yes  Requested medication (s) are on the active medication list:   Yes  Future visit scheduled:   No   Last ordered: 06/10/2021 #90, 0 refill  Returned because protocol failed due to labs being due.   Requested Prescriptions  Pending Prescriptions Disp Refills   rosuvastatin (CRESTOR) 10 MG tablet [Pharmacy Med Name: ROSUVASTATIN CALCIUM 10 MG Tablet] 90 tablet 0    Sig: TAKE 1 TABLET EVERY DAY     Cardiovascular:  Antilipid - Statins Failed - 12/14/2021  5:06 AM      Failed - HDL in normal range and within 360 days    HDL  Date Value Ref Range Status  07/09/2021 38 (L) > OR = 40 mg/dL Final          Failed - Triglycerides in normal range and within 360 days    Triglycerides  Date Value Ref Range Status  07/09/2021 399 (H) <150 mg/dL Final    Comment:    . If a non-fasting specimen was collected, consider repeat triglyceride testing on a fasting specimen if clinically indicated.  Yates Decamp et al. J. of Clin. Lipidol. 2637;8:588-502. Marland Kitchen           Passed - Total Cholesterol in normal range and within 360 days    Cholesterol  Date Value Ref Range Status  07/09/2021 108 <200 mg/dL Final          Passed - LDL in normal range and within 360 days    LDL Cholesterol (Calc)  Date Value Ref Range Status  07/09/2021 28 mg/dL (calc) Final    Comment:    Reference range: <100 . Desirable range <100 mg/dL for primary prevention;   <70 mg/dL for patients with CHD or diabetic patients  with > or = 2 CHD risk factors. Marland Kitchen LDL-C is now calculated using the Martin-Hopkins  calculation, which is a validated novel method providing  better accuracy than the Friedewald equation in the  estimation of LDL-C.  Cresenciano Genre et al. Annamaria Helling. 7741;287(86): 2061-2068  (http://education.[facility].com/faq/FAQ164)    Direct LDL  Date Value Ref Range Status  06/23/2020 40.0 mg/dL Final    Comment:    Optimal:  <100 mg/dLNear or Above  Optimal:  100-129 mg/dLBorderline High:  130-159 mg/dLHigh:  160-189 mg/dLVery High:  >190 mg/dL          Passed - Patient is not pregnant      Passed - Valid encounter within last 12 months    Recent Outpatient Visits           5 months ago Type 2 diabetes mellitus with hyperglycemia, without long-term current use of insulin [facility])   [place], NP   5 months ago Medicare annual wellness visit, subsequent   [facility] [place], Coralie Keens, NP

## 2021-12-15 ENCOUNTER — Encounter: Payer: Self-pay | Admitting: Internal Medicine

## 2021-12-21 DIAGNOSIS — Z0100 Encounter for examination of eyes and vision without abnormal findings: Secondary | ICD-10-CM | POA: Diagnosis not present

## 2021-12-21 LAB — HM DIABETES EYE EXAM

## 2021-12-31 ENCOUNTER — Encounter: Payer: Self-pay | Admitting: Internal Medicine

## 2021-12-31 MED ORDER — SILDENAFIL CITRATE 50 MG PO TABS: 25.0000 mg | ORAL_TABLET | Freq: Every day | ORAL | 2 refills | Status: DC | PRN

## 2022-01-01 ENCOUNTER — Other Ambulatory Visit: Payer: Self-pay

## 2022-02-25 ENCOUNTER — Other Ambulatory Visit: Payer: Self-pay | Admitting: Internal Medicine

## 2022-02-25 NOTE — Telephone Encounter (Signed)
Requested medication (s) are due for refill today:   Yes ? ?Requested medication (s) are on the active medication list:   Yes ? ?Future visit scheduled:   No   Left a voicemail to call in and make an appt ? ? ?Last ordered: 10/02/2021 #90, 0 refills ? ?Returned because of note "Schedule a follow up appt prior to further refills"   ? ?Requested Prescriptions  ?Pending Prescriptions Disp Refills  ? ezetimibe (ZETIA) 10 MG tablet [Pharmacy Med Name: EZETIMIBE 10 MG Tablet] 90 tablet 0  ?  Sig: TAKE 1 TABLET EVERY DAY (SCHEDULE A FOLLOW UP APPOINTMENT PRIOR TO FURTHER REFILLS)  ?  ? Cardiovascular:  Antilipid - Sterol Transport Inhibitors Failed - 02/25/2022  3:16 AM  ?  ?  Failed - Lipid Panel in normal range within the last 12 months  ?  Cholesterol  ?Date Value Ref Range Status  ?07/09/2021 108 <200 mg/dL Final  ? ?LDL Cholesterol (Calc)  ?Date Value Ref Range Status  ?07/09/2021 28 mg/dL (calc) Final  ?  Comment:  ?  Reference range: <100 ?Marland Kitchen ?Desirable range <100 mg/dL for primary prevention;   ?<70 mg/dL for patients with CHD or diabetic patients  ?with > or = 2 CHD risk factors. ?. ?LDL-C is now calculated using the Martin-Hopkins  ?calculation, which is a validated novel method providing  ?better accuracy than the Friedewald equation in the  ?estimation of LDL-C.  ?Cresenciano Genre et al. Annamaria Helling. 2706;237(62): 2061-2068  ?(http://education.[facility].com/faq/FAQ164) ?  ? ?Direct LDL  ?Date Value Ref Range Status  ?06/23/2020 40.0 mg/dL Final  ?  Comment:  ?  Optimal:  <100 mg/dLNear or Above Optimal:  100-129 mg/dLBorderline High:  130-159 mg/dLHigh:  160-189 mg/dLVery High:  >190 mg/dL  ? ?HDL  ?Date Value Ref Range Status  ?07/09/2021 38 (L) > OR = 40 mg/dL Final  ? ?Triglycerides  ?Date Value Ref Range Status  ?07/09/2021 399 (H) <150 mg/dL Final  ?  Comment:  ?  . ?If a non-fasting specimen was collected, consider ?repeat triglyceride testing on a fasting specimen ?if clinically indicated.  ?Garald Balding al. J. of  Clin. Lipidol. 8315;1:761-607. ?. ?  ? ?  ?  ?  Passed - AST in normal range and within 360 days  ?  AST  ?Date Value Ref Range Status  ?07/09/2021 26 10 - 35 U/L Final  ?  ?  ?  ?  Passed - ALT in normal range and within 360 days  ?  ALT  ?Date Value Ref Range Status  ?07/09/2021 36 9 - 46 U/L Final  ?  ?  ?  ?  Passed - Patient is not pregnant  ?  ?  Passed - Valid encounter within last 12 months  ?  Recent Outpatient Visits   ? ?      ? 7 months ago Type 2 diabetes mellitus with hyperglycemia, without long-term current use of insulin ([place])  ? [facility] [place], Coralie Keens, NP  ? 7 months ago Medicare annual wellness visit, subsequent  ? [facility], Coralie Keens, NP  ? ?  ?  ? ?  ?  ?  ? ?

## 2022-03-02 ENCOUNTER — Ambulatory Visit (INDEPENDENT_AMBULATORY_CARE_PROVIDER_SITE_OTHER): Payer: Medicare HMO | Admitting: Internal Medicine

## 2022-03-02 ENCOUNTER — Encounter: Payer: Self-pay | Admitting: Internal Medicine

## 2022-03-02 VITALS — BP 136/84 | HR 82 | Temp 97.1°F | Wt 171.0 lb

## 2022-03-02 DIAGNOSIS — Z6826 Body mass index (BMI) 26.0-26.9, adult: Secondary | ICD-10-CM

## 2022-03-02 DIAGNOSIS — E1165 Type 2 diabetes mellitus with hyperglycemia: Secondary | ICD-10-CM | POA: Diagnosis not present

## 2022-03-02 DIAGNOSIS — E782 Mixed hyperlipidemia: Secondary | ICD-10-CM | POA: Diagnosis not present

## 2022-03-02 DIAGNOSIS — J410 Simple chronic bronchitis: Secondary | ICD-10-CM | POA: Diagnosis not present

## 2022-03-02 DIAGNOSIS — E663 Overweight: Secondary | ICD-10-CM

## 2022-03-02 DIAGNOSIS — I10 Essential (primary) hypertension: Secondary | ICD-10-CM | POA: Diagnosis not present

## 2022-03-02 DIAGNOSIS — N522 Drug-induced erectile dysfunction: Secondary | ICD-10-CM | POA: Diagnosis not present

## 2022-03-02 MED ORDER — ASPIRIN 81 MG PO TBEC: 81.0000 mg | DELAYED_RELEASE_TABLET | Freq: Every day | ORAL | 1 refills | Status: DC

## 2022-03-02 NOTE — Assessment & Plan Note (Signed)
A1c and urine microalbumin today ?Encouraged him to consume a low-carb diet and exercise for weight loss ?Continue Metformin ?We will request copy of eye exam ?Encourage routine foot exam ?Encouraged him to get a flu shot in the fall ?Pneumovax and Prevnar UTD ?Encouraged him to get his COVID booster ?

## 2022-03-02 NOTE — Assessment & Plan Note (Signed)
Controlled on Losartan ?Reinforced DASH diet and exercise for weight loss ?C-Met today ?

## 2022-03-02 NOTE — Assessment & Plan Note (Signed)
C-Met and lipid profile today ?Encouraged him to consume a low-fat diet ?Continue Rosuvastatin, Ezetimibe and Fish Oil ?Advised him to start a baby aspirin daily ?

## 2022-03-02 NOTE — Progress Notes (Signed)
? ?Subjective:  ? ? Patient ID: Charles Smith, male    DOB: [birth year], 71 y.o.   MRN: [id number] ? ?HPI ? ?Patient presents to clinic today for 72-monthfollow-up of chronic conditions. ? ?HTN: His BP today is 136/84.  He is taking Losartan as prescribed.  There is no ECG on file. ? ?HLD: His last LDL was 28, triglycerides 399, 06/2021.  He denies myalgias on Rosuvastatin, Fish Oil and Ezetimibe.  He does not consume a low-fat diet. ? ?ED: He has difficulty maintaining an erection.  He takes Sildenafil as needed with good results.  He does not follow with urology. ? ?COPD: He denies chronic cough or shortness of breath.  He is not using any inhalers at this time.  There are no PFTs on file.  He does not follow with hematology. ? ?DM2: His last A1c was 14%, 06/2021.  He is taking Metformin as prescribed.  His sugars range 118-136.  He checks his feet routinely.  [facility]  Flu 10/05/2019.  Pneumovax 11/2016.  Prevnar 04/2015 .  [place]. ? ?Review of Systems ? ?   ?Past Medical History:  ?Diagnosis Date  ? History of chicken pox   ? ? ?Current Outpatient Medications  ?Medication Sig Dispense Refill  ? Ascorbic Acid (VITAMIN C) 1000 MG tablet Take 1,000 mg by mouth daily.    ? blood glucose meter kit and supplies Check blood sugar twice daily 1 each 0  ? ezetimibe (ZETIA) 10 MG tablet TAKE 1 TABLET EVERY DAY 90 tablet 0  ? losartan (COZAAR) 100 MG tablet TAKE 1 TABLET EVERY DAY 90 tablet 0  ? metFORMIN (GLUCOPHAGE) 1000 MG tablet TAKE 1/2 TAB DAILY X7 DAYS, 1/2 TAB TWICE DAILY WITH MEALS X7 DAYS, THEN 1 TAB TWICE DAILY WITH MEAL 180 tablet 3  ? Multiple Vitamins-Minerals (CENTRUM ADULTS PO) Take 1 capsule by mouth daily.    ? Omega-3 Fatty Acids (FISH OIL) 1000 MG CAPS Take 1,000 mg by mouth daily.     ? rosuvastatin (CRESTOR) 10 MG tablet TAKE 1 TABLET EVERY DAY 90 tablet 0  ? sildenafil (VIAGRA) 50 MG tablet Take 0.5 tablets (25 mg total) by mouth daily as needed for  erectile dysfunction. 30 tablet 2  ? ?No current facility-administered medications for this visit.  ? ? ?No Known Allergies ? ?Family History  ?Problem Relation Age of Onset  ? Hyperlipidemia Sister   ? Heart disease Brother   ?     heart transplant  ? Kidney cancer Brother   ? Depression Neg Hx   ? Stroke Neg Hx   ? ? ?Social History  ? ?Socioeconomic History  ? Marital status: Divorced  ?  Spouse name: Not on file  ? Number of children: Not on file  ? Years of education: Not on file  ? Highest education level: Not on file  ?Occupational History  ? Not on file  ?Tobacco Use  ? Smoking status: Former  ?  Types: Cigarettes  ? Smokeless tobacco: Never  ?Vaping Use  ? Vaping Use: Never used  ?Substance and Sexual Activity  ? Alcohol use: Yes  ?  Alcohol/week: 0.0 standard drinks  ?  Comment: occasional  ? Drug use: No  ? Sexual activity: Yes  ?Other Topics Concern  ? Not on file  ?Social History Narrative  ? Not on file  ? ?Social Determinants of Health  ? ?Financial Resource Strain: Not on file  ?  Food Insecurity: Not on file  ?Transportation Needs: Not on file  ?Physical Activity: Not on file  ?Stress: Not on file  ?Social Connections: Not on file  ?Intimate Partner Violence: Not on file  ? ? ? ?Constitutional: Denies fever, malaise, fatigue, headache or abrupt weight changes.  ?HEENT: Denies eye pain, eye redness, ear pain, ringing in the ears, wax buildup, runny nose, nasal congestion, bloody nose, or sore throat. ?Respiratory: Denies difficulty breathing, shortness of breath, cough or sputum production.   ?Cardiovascular: Denies chest pain, chest tightness, palpitations or swelling in the hands or feet.  ?Gastrointestinal: Denies abdominal pain, bloating, constipation, diarrhea or blood in the stool.  ?GU: Patient reports erectile dysfunction.  Denies urgency, frequency, pain with urination, burning sensation, blood in urine, odor or discharge. ?Musculoskeletal: Denies decrease in range of motion, difficulty with  gait, muscle pain or joint pain and swelling.  ?Skin: Denies redness, rashes, lesions or ulcercations.  ?Neurological: Denies dizziness, difficulty with memory, difficulty with speech or problems with balance and coordination.  ?Psych: Denies anxiety, depression, SI/HI. ? ?No other specific complaints in a complete review of systems (except as listed in HPI above). ? ?Objective:  ? Physical Exam ? ?BP 136/84 (BP Location: Right Arm, Patient Position: Sitting, Cuff Size: Large)   Pulse 82   Temp (!) 97.1 ?F (36.2 ?C) (Temporal)   Wt 171 lb (77.6 kg)   SpO2 99%   BMI 26.78 kg/m?  ? ?Wt Readings from Last 3 Encounters:  ?07/13/21 181 lb 3.2 oz (82.2 kg)  ?07/09/21 180 lb (81.6 kg)  ?06/23/20 202 lb (91.6 kg)  ? ? ?General: Appears his stated age, overweight, in NAD. ?Skin: Warm, dry and intact. No ulcerations noted. ?HEENT: Head: normal shape and size; Eyes: sclera white and EOMs intact;  ?Cardiovascular: Normal rate and rhythm. S1,S2 noted.  No murmur, rubs or gallops noted. No JVD or BLE edema. No carotid bruits noted. ?Pulmonary/Chest: Normal effort and positive vesicular breath sounds. No respiratory distress. No wheezes, rales or ronchi noted.  ?Musculoskeletal: No difficulty with gait.  ?Neurological: Alert and oriented.  ? ? ?BMET ?   ?Component Value Date/Time  ? NA 130 (L) 07/09/2021 1452  ? K 4.8 07/09/2021 1452  ? CL 94 (L) 07/09/2021 1452  ? CO2 25 07/09/2021 1452  ? GLUCOSE 509 (HH) 07/09/2021 1452  ? BUN 17 07/09/2021 1452  ? CREATININE 1.04 07/09/2021 1452  ? CALCIUM 9.6 07/09/2021 1452  ? ? ?Lipid Panel  ?   ?Component Value Date/Time  ? CHOL 108 07/09/2021 1452  ? TRIG 399 (H) 07/09/2021 1452  ? HDL 38 (L) 07/09/2021 1452  ? CHOLHDL 2.8 07/09/2021 1452  ? VLDL 69.6 (H) 09/14/2019 1353  ? [place] 28 07/09/2021 1452  ? ? ?CBC ?   ?Component Value Date/Time  ? WBC 11.0 (H) 07/09/2021 1452  ? RBC 5.46 07/09/2021 1452  ? HGB 16.2 07/09/2021 1452  ? HCT 49.6 07/09/2021 1452  ? PLT 263 07/09/2021 1452  ?  MCV 90.8 07/09/2021 1452  ? MCH 29.7 07/09/2021 1452  ? MCHC 32.7 07/09/2021 1452  ? RDW 12.6 07/09/2021 1452  ? ? ?Hgb A1C ?Lab Results  ?Component Value Date  ? HGBA1C 14.0 (A) 07/10/2021  ? ? ? ? ? ? ?   ?Assessment & Plan:  ? ? ? ?[place], NP ? ?

## 2022-03-02 NOTE — Assessment & Plan Note (Signed)
Continue Sildenafil as needed 

## 2022-03-02 NOTE — Assessment & Plan Note (Signed)
Encourage diet and exercise for weight loss 

## 2022-03-02 NOTE — Assessment & Plan Note (Signed)
Asymptomatic ?Non-smoking ?We will monitor ?

## 2022-03-02 NOTE — Patient Instructions (Signed)

## 2022-03-04 LAB — HEMOGLOBIN A1C
Hgb A1c MFr Bld: 5.9 % of total Hgb — ABNORMAL HIGH (ref <5.7)
Mean Plasma Glucose: 123 mg/dL
eAG (mmol/L): 6.8 mmol/L

## 2022-03-04 LAB — COMPLETE METABOLIC PANEL WITH GFR
AG Ratio: 1.8 (calc) (ref 1.0–2.5)
ALT: 37 U/L (ref 9–46)
AST: 30 U/L (ref 10–35)
Albumin: 4.6 g/dL (ref 3.6–5.1)
Alkaline phosphatase (APISO): 51 U/L (ref 35–144)
BUN: 18 mg/dL (ref 7–25)
CO2: 23 mmol/L (ref 20–32)
Calcium: 9.4 mg/dL (ref 8.6–10.3)
Chloride: 106 mmol/L (ref 98–110)
Creat: 0.91 mg/dL (ref 0.70–1.28)
Globulin: 2.6 g/dL (calc) (ref 1.9–3.7)
Glucose, Bld: 85 mg/dL (ref 65–139)
Potassium: 4.5 mmol/L (ref 3.5–5.3)
Sodium: 140 mmol/L (ref 135–146)
Total Bilirubin: 1.7 mg/dL — ABNORMAL HIGH (ref 0.2–1.2)
Total Protein: 7.2 g/dL (ref 6.1–8.1)
eGFR: 90 mL/min/{1.73_m2} (ref >=60)

## 2022-03-04 LAB — LIPID PANEL
Cholesterol: 74 mg/dL (ref <200)
HDL: 45 mg/dL (ref >=40)
LDL Cholesterol (Calc): <10 mg/dL (calc)
Non-HDL Cholesterol (Calc): 29 mg/dL (calc) (ref <130)
Total CHOL/HDL Ratio: 1.6 (calc) (ref <5.0)
Triglycerides: 139 mg/dL (ref <150)

## 2022-03-04 LAB — MICROALBUMIN / CREATININE URINE RATIO
Creatinine, Urine: 76 mg/dL (ref 20–320)
Microalb Creat Ratio: 8 mcg/mg creat (ref <30)
Microalb, Ur: 0.6 mg/dL

## 2022-03-12 ENCOUNTER — Encounter: Payer: Self-pay | Admitting: Internal Medicine

## 2022-05-02 ENCOUNTER — Other Ambulatory Visit: Payer: Self-pay | Admitting: Internal Medicine

## 2022-05-03 NOTE — Telephone Encounter (Signed)
Requested Prescriptions  Pending Prescriptions Disp Refills  . losartan (COZAAR) 100 MG tablet [Pharmacy Med Name: LOSARTAN POTASSIUM 100 MG Tablet] 90 tablet 0    Sig: TAKE 1 TABLET EVERY DAY     Cardiovascular:  Angiotensin Receptor Blockers Passed - 05/02/2022  3:20 AM      Passed - Cr in normal range and within 180 days    Creat  Date Value Ref Range Status  03/02/2022 0.91 0.70 - 1.28 mg/dL Final   Creatinine, Urine  Date Value Ref Range Status  03/02/2022 76 20 - 320 mg/dL Final         Passed - K in normal range and within 180 days    Potassium  Date Value Ref Range Status  03/02/2022 4.5 3.5 - 5.3 mmol/L Final         Passed - Patient is not pregnant      Passed - Last BP in normal range    BP Readings from Last 1 Encounters:  03/02/22 136/84         Passed - Valid encounter within last 6 months    Recent Outpatient Visits          2 months ago Type 2 diabetes mellitus with hyperglycemia, without long-term current use of insulin [facility])   [facility] [place], Coralie Keens, NP   9 months ago Type 2 diabetes mellitus with hyperglycemia, without long-term current use of insulin [facility])   [facility] [place], Coralie Keens, NP   9 months ago Medicare annual wellness visit, subsequent   [facility] [place], [place]alie Keens, [place]

## 2022-05-09 ENCOUNTER — Other Ambulatory Visit: Payer: Self-pay | Admitting: Internal Medicine

## 2022-05-13 ENCOUNTER — Other Ambulatory Visit: Payer: Self-pay | Admitting: Internal Medicine

## 2022-05-13 NOTE — Telephone Encounter (Signed)
Requested Prescriptions  Pending Prescriptions Disp Refills  . ezetimibe (ZETIA) 10 MG tablet [Pharmacy Med Name: EZETIMIBE 10 MG Tablet] 90 tablet 0    Sig: TAKE 1 TABLET EVERY DAY (SCHEDULE A FOLLOW UP APPOINTMENT PRIOR TO FURTHER REFILLS)     Cardiovascular:  Antilipid - Sterol Transport Inhibitors Failed - 05/13/2022  5:07 PM      Failed - Lipid Panel in normal range within the last 12 months    Cholesterol  Date Value Ref Range Status  03/02/2022 74 <200 mg/dL Final   LDL Cholesterol (Calc)  Date Value Ref Range Status  03/02/2022 <10 mg/dL (calc) Final    Comment:    Reference range: <100 . Desirable range <100 mg/dL for primary prevention;   <70 mg/dL for patients with CHD or diabetic patients  with > or = 2 CHD risk factors. Marland Kitchen LDL-C is now calculated using the Martin-Hopkins  calculation, which is a validated novel method providing  better accuracy than the Friedewald equation in the  estimation of LDL-C.  Cresenciano Genre et al. Annamaria Helling. 8413;244(01): 2061-2068  (http://education.[facility].com/faq/FAQ164)    Direct LDL  Date Value Ref Range Status  06/23/2020 40.0 mg/dL Final    Comment:    Optimal:  <100 mg/dLNear or Above Optimal:  100-129 mg/dLBorderline High:  130-159 mg/dLHigh:  160-189 mg/dLVery High:  >190 mg/dL   HDL  Date Value Ref Range Status  03/02/2022 45 > OR = 40 mg/dL Final   Triglycerides  Date Value Ref Range Status  03/02/2022 139 <150 mg/dL Final         Passed - AST in normal range and within 360 days    AST  Date Value Ref Range Status  03/02/2022 30 10 - 35 U/L Final         Passed - ALT in normal range and within 360 days    ALT  Date Value Ref Range Status  03/02/2022 37 9 - 46 U/L Final         Passed - Patient is not pregnant      Passed - Valid encounter within last 12 months    Recent Outpatient Visits          2 months ago Type 2 diabetes mellitus with hyperglycemia, without long-term current use of insulin [facility])    [facility] [place], Coralie Keens, NP   10 months ago Type 2 diabetes mellitus with hyperglycemia, without long-term current use of insulin [facility])   [facility] [place], Coralie Keens, NP   10 months ago Medicare annual wellness visit, subsequent   [facility] [place], Coralie Keens, [place]

## 2022-07-03 ENCOUNTER — Other Ambulatory Visit: Payer: Self-pay | Admitting: Internal Medicine

## 2022-07-05 NOTE — Telephone Encounter (Signed)
Requested Prescriptions  Pending Prescriptions Disp Refills  . metFORMIN (GLUCOPHAGE) 1000 MG tablet [Pharmacy Med Name: METFORMIN HYDROCHLORIDE 1000 MG Tablet] 180 tablet 0    Sig: TAKE 1 TABLET (1,000 MG TOTAL) BY MOUTH 2 (TWO) TIMES DAILY WITH A MEAL.     Endocrinology:  Diabetes - Biguanides Failed - 07/03/2022  6:02 AM      Failed - B12 Level in normal range and within 720 days    No results found for: "VITAMINB12"       Failed - CBC within normal limits and completed in the last 12 months    WBC  Date Value Ref Range Status  07/09/2021 11.0 (H) 3.8 - 10.8 Thousand/uL Final   RBC  Date Value Ref Range Status  07/09/2021 5.46 4.20 - 5.80 Million/uL Final   Hemoglobin  Date Value Ref Range Status  07/09/2021 16.2 13.2 - 17.1 g/dL Final   HCT  Date Value Ref Range Status  07/09/2021 49.6 38.5 - 50.0 % Final   MCHC  Date Value Ref Range Status  07/09/2021 32.7 32.0 - 36.0 g/dL Final   [facility]  Date Value Ref Range Status  07/09/2021 29.7 27.0 - 33.0 pg Final   MCV  Date Value Ref Range Status  07/09/2021 90.8 80.0 - 100.0 fL Final   No results found for: "PLTCOUNTKUC", "LABPLAT", "POCPLA" RDW  Date Value Ref Range Status  07/09/2021 12.6 11.0 - 15.0 % Final         Passed - Cr in normal range and within 360 days    Creat  Date Value Ref Range Status  03/02/2022 0.91 0.70 - 1.28 mg/dL Final   Creatinine, Urine  Date Value Ref Range Status  03/02/2022 76 20 - 320 mg/dL Final         Passed - HBA1C is between 0 and 7.9 and within 180 days    Hgb A1c MFr Bld  Date Value Ref Range Status  03/02/2022 5.9 (H) <5.7 % of total Hgb Final    Comment:    For someone without known diabetes, a hemoglobin  A1c value between 5.7% and 6.4% is consistent with prediabetes and should be confirmed with a  follow-up test. . For someone with known diabetes, a value <7% indicates that their diabetes is well controlled. A1c targets should be individualized based on duration  of diabetes, age, comorbid conditions, and other considerations. . This assay result is consistent with an increased risk of diabetes. . Currently, no consensus exists regarding use of hemoglobin A1c for diagnosis of diabetes for children. .          Passed - eGFR in normal range and within 360 days    GFR  Date Value Ref Range Status  06/23/2020 61.60 >60.00 mL/min Final   eGFR  Date Value Ref Range Status  03/02/2022 90 > OR = 60 mL/min/1.37m Final    Comment:    The eGFR is based on the CKD-EPI 2021 equation. To calculate  the new eGFR from a previous Creatinine or Cystatin C result, go to https://www.kidney.org/professionals/ kdoqi/gfr%5Fcalculator          Passed - Valid encounter within last 6 months    Recent Outpatient Visits          4 months ago Type 2 diabetes mellitus with hyperglycemia, without long-term current use of insulin ([facility] [place] [facility]   11 months ago Type 2 diabetes mellitus with hyperglycemia, without long-term current use  of insulin [facility])   [facility] [place], Coralie Keens, NP   12 months ago Medicare annual wellness visit, subsequent   [facility] [place], [place]alie Keens, [place]

## 2022-07-13 ENCOUNTER — Telehealth: Payer: Self-pay

## 2022-07-13 NOTE — Telephone Encounter (Signed)
Left message for patient to call back to schedule his medicare wellness visit . Appointments available on Sept 11th after 1:00 pm.

## 2022-07-14 NOTE — Telephone Encounter (Signed)
Pt returned call and would like to be scheduled for his AWV around 4, any day from September 11th and beyond is fine for him.   Best contact: [phone]

## 2022-07-26 ENCOUNTER — Ambulatory Visit: Payer: Medicare HMO

## 2022-08-10 ENCOUNTER — Ambulatory Visit (INDEPENDENT_AMBULATORY_CARE_PROVIDER_SITE_OTHER): Payer: Medicare HMO

## 2022-08-10 VITALS — BP 130/70 | HR 77 | Temp 97.8°F | Resp 17 | Ht 66.0 in | Wt 184.6 lb

## 2022-08-10 DIAGNOSIS — Z23 Encounter for immunization: Secondary | ICD-10-CM

## 2022-08-10 DIAGNOSIS — Z0000 Encounter for general adult medical examination without abnormal findings: Secondary | ICD-10-CM | POA: Diagnosis not present

## 2022-08-10 NOTE — Patient Instructions (Signed)
Health Maintenance, Male Adopting a healthy lifestyle and getting preventive care are important in promoting health and wellness. Ask your health care provider about: The right schedule for you to have regular tests and exams. Things you can do on your own to prevent diseases and keep yourself healthy. What should I know about diet, weight, and exercise? Eat a healthy diet  Eat a diet that includes plenty of vegetables, fruits, low-fat dairy products, and lean protein. Do not eat a lot of foods that are high in solid fats, added sugars, or sodium. Maintain a healthy weight Body mass index (BMI) is a measurement that can be used to identify possible weight problems. It estimates body fat based on height and weight. Your health care provider can help determine your BMI and help you achieve or maintain a healthy weight. Get regular exercise Get regular exercise. This is one of the most important things you can do for your health. Most adults should: Exercise for at least 150 minutes each week. The exercise should increase your heart rate and make you sweat (moderate-intensity exercise). Do strengthening exercises at least twice a week. This is in addition to the moderate-intensity exercise. Spend less time sitting. Even light physical activity can be beneficial. Watch cholesterol and blood lipids Have your blood tested for lipids and cholesterol at 72 years of age, then have this test every 5 years. You may need to have your cholesterol levels checked more often if: Your lipid or cholesterol levels are high. You are older than 72 years of age. You are at high risk for heart disease. What should I know about cancer screening? Many types of cancers can be detected early and may often be prevented. Depending on your health history and family history, you may need to have cancer screening at various ages. This may include screening for: Colorectal cancer. Prostate cancer. Skin cancer. Lung  cancer. What should I know about heart disease, diabetes, and high blood pressure? Blood pressure and heart disease High blood pressure causes heart disease and increases the risk of stroke. This is more likely to develop in people who have high blood pressure readings or are overweight. Talk with your health care provider about your target blood pressure readings. Have your blood pressure checked: Every 3-5 years if you are 18-39 years of age. Every year if you are 40 years old or older. If you are between the ages of 65 and 75 and are a current or former smoker, ask your health care provider if you should have a one-time screening for abdominal aortic aneurysm (AAA). Diabetes Have regular diabetes screenings. This checks your fasting blood sugar level. Have the screening done: Once every three years after age 45 if you are at a normal weight and have a low risk for diabetes. More often and at a younger age if you are overweight or have a high risk for diabetes. What should I know about preventing infection? Hepatitis B If you have a higher risk for hepatitis B, you should be screened for this virus. Talk with your health care provider to find out if you are at risk for hepatitis B infection. Hepatitis C Blood testing is recommended for: Everyone born from 1945 through 1965. Anyone with known risk factors for hepatitis C. Sexually transmitted infections (STIs) You should be screened each year for STIs, including gonorrhea and chlamydia, if: You are sexually active and are younger than 72 years of age. You are older than 72 years of age and your   health care provider tells you that you are at risk for this type of infection. Your sexual activity has changed since you were last screened, and you are at increased risk for chlamydia or gonorrhea. Ask your health care provider if you are at risk. Ask your health care provider about whether you are at high risk for HIV. Your health care provider  may recommend a prescription medicine to help prevent HIV infection. If you choose to take medicine to prevent HIV, you should first get tested for HIV. You should then be tested every 3 months for as long as you are taking the medicine. Follow these instructions at home: Alcohol use Do not drink alcohol if your health care provider tells you not to drink. If you drink alcohol: Limit how much you have to 0-2 drinks a day. Know how much alcohol is in your drink. In the [place] one drink equals one 12 oz bottle of beer (355 mL), one 5 oz glass of wine (148 mL), or one 1 oz glass of hard liquor (44 mL). Lifestyle Do not use any products that contain nicotine or tobacco. These products include cigarettes, chewing tobacco, and vaping devices, such as e-cigarettes. If you need help quitting, ask your health care provider. Do not use street drugs. Do not share needles. Ask your health care provider for help if you need support or information about quitting drugs. General instructions Schedule regular health, dental, and eye exams. Stay current with your vaccines. Tell your health care provider if: You often feel depressed. You have ever been abused or do not feel safe at home. Summary Adopting a healthy lifestyle and getting preventive care are important in promoting health and wellness. Follow your health care provider's instructions about healthy diet, exercising, and getting tested or screened for diseases. Follow your health care provider's instructions on monitoring your cholesterol and blood pressure. This information is not intended to replace advice given to you by your health care provider. Make sure you discuss any questions you have with your health care provider. Document Revised: 03/23/2021 Document Reviewed: 03/23/2021 Elsevier Patient Education  2023 Elsevier Inc.  

## 2022-08-10 NOTE — Progress Notes (Signed)
Subjective:   Charles Smith is a 71 y.o. male who presents for Medicare Annual/Subsequent preventive examination.  Review of Systems    Per HPI unless specifically indicated below.  Cardiac Risk Factors include: advanced age (>57men, >49 women);male gender, hypertension, Type 2 diabetes mellitus with hyperglycemia, and mixed hyperlipidemia.           Objective:    Today's Vitals   08/10/22 1536 08/10/22 1544  BP: 130/70   Pulse: 77   Resp: 17   Temp: 97.8 F (36.6 C)   TempSrc: Oral   SpO2: 100%   Weight: 184 lb 9.6 oz (83.7 kg)   Height: $Remove'5\' 6"'EKKiSWh$  (1.676 m)   PainSc: 0-No pain 0-No pain   Body mass index is 29.8 kg/m.      No data to display          Current Medications (verified) Outpatient Encounter Medications as of 08/10/2022  Medication Sig   Ascorbic Acid (VITAMIN C) 1000 MG tablet Take 1,000 mg by mouth daily.   aspirin 81 MG EC tablet Take 1 tablet (81 mg total) by mouth daily. Swallow whole.   blood glucose meter kit and supplies Check blood sugar twice daily   ezetimibe (ZETIA) 10 MG tablet TAKE 1 TABLET EVERY DAY (SCHEDULE A FOLLOW UP APPOINTMENT PRIOR TO FURTHER REFILLS)   losartan (COZAAR) 100 MG tablet TAKE 1 TABLET EVERY DAY   metFORMIN (GLUCOPHAGE) 1000 MG tablet TAKE 1 TABLET (1,000 MG TOTAL) BY MOUTH 2 (TWO) TIMES DAILY WITH A MEAL.   Multiple Vitamins-Minerals (CENTRUM ADULTS PO) Take 1 capsule by mouth daily.   Omega-3 Fatty Acids (FISH OIL) 1000 MG CAPS Take 1,000 mg by mouth daily.    rosuvastatin (CRESTOR) 10 MG tablet TAKE 1 TABLET EVERY DAY   sildenafil (VIAGRA) 50 MG tablet Take 0.5 tablets (25 mg total) by mouth daily as needed for erectile dysfunction.   No facility-administered encounter medications on file as of 08/10/2022.    Allergies (verified) Patient has no known allergies.   History: Past Medical History:  Diagnosis Date   History of chicken pox    Past Surgical History:  Procedure Laterality Date   CHOLECYSTECTOMY   2001   Family History  Problem Relation Age of Onset   Hyperlipidemia Sister    Heart disease Brother        heart transplant   Kidney cancer Brother    Depression Neg Hx    Stroke Neg Hx    Social History   Socioeconomic History   Marital status: Divorced    Spouse name: Not on file   Number of children: 3   Years of education: Not on file   Highest education level: Not on file  Occupational History   Occupation: Retired  Tobacco Use   Smoking status: Former    Types: Cigarettes   Smokeless tobacco: Never  Vaping Use   Vaping Use: Never used  Substance and Sexual Activity   Alcohol use: Yes    Alcohol/week: 0.0 standard drinks of alcohol    Comment: occasional   Drug use: No   Sexual activity: Yes  Other Topics Concern   Not on file  Social History Narrative   Not on file   Social Determinants of Health   Financial Resource Strain: Low Risk  (08/10/2022)   Overall Financial Resource Strain (CARDIA)    Difficulty of Paying Living Expenses: Not hard at all  Food Insecurity: Not on file  Transportation Needs: No Transportation Needs (  08/10/2022)   PRAPARE - Hydrologist (Medical): No    Lack of Transportation (Non-Medical): No  Physical Activity: Insufficiently Active (08/10/2022)   Exercise Vital Sign    Days of Exercise per Week: 7 days    Minutes of Exercise per Session: 20 min  Stress: No Stress Concern Present (08/10/2022)   [place]    Feeling of Stress : Not at all  Social Connections: Socially Isolated (08/10/2022)   Social Connection and Isolation Panel [NHANES]    Frequency of Communication with Friends and Family: More than three times a week    Frequency of Social Gatherings with Friends and Family: More than three times a week    Attends Religious Services: Never    Marine scientist or Organizations: No    Attends Archivist Meetings:  Never    Marital Status: Divorced    Tobacco Counseling Counseling given: No Not applicable.  Clinical Intake:  Pre-visit preparation completed: No  Pain : 0-10 Pain Score: 0-No pain     Nutritional Status: BMI 25 -29 Overweight Nutritional Risks: None Diabetes: Yes CBG done?: Yes CBG resulted in Enter/ Edit results?: No Did pt. bring in CBG monitor from home?: No  How often do you need to have someone help you when you read instructions, pamphlets, or other written materials from your doctor or pharmacy?: 1 - Never  Diabetic?Nutrition Risk Assessment:  Has the patient had any N/V/D within the last 2 months?  No  Does the patient have any non-healing wounds?  No  Has the patient had any unintentional weight loss or weight gain?  No   Diabetes:  Is the patient diabetic?  Yes  If diabetic, was a CBG obtained today?  No  Did the patient bring in their glucometer from home?  No  How often do you monitor your CBG's? Twice a week.   Financial Strains and Diabetes Management:  Are you having any financial strains with the device, your supplies or your medication? No .  Does the patient want to be seen by Chronic Care Management for management of their diabetes?  No  Would the patient like to be referred to a Nutritionist or for Diabetic Management?  No   Diabetic Exams:  Diabetic Eye Exam: Completed 12/21/2021 Diabetic Foot Exam: Completed 03/02/2022    Interpreter Needed?: No  Information entered by :: Donnie [place], [place]   Activities of Daily Living    08/10/2022    3:43 PM 03/02/2022    2:21 PM  In your present state of health, do you have any difficulty performing the following activities:  Hearing? 0 0  Vision? 1 0  Difficulty concentrating or making decisions? 0 0  Walking or climbing stairs? 0 0  Dressing or bathing? 0 0  Doing errands, shopping? 0 0    Patient Care Team: Jearld Fenton, NP as PCP - General (Internal Medicine)  Indicate any  recent Medical Services you may have received from other than [facility] providers in the past year (date may be approximate). No hospitalization in the past 12 months.     Assessment:   This is a routine wellness examination for Charles Smith.  Hearing/Vision screen Denies any hearing issues. Denies any vision issues. Annual Eye Exam done at [facility]. Dietary issues and exercise activities discussed: Current Exercise Habits: Structured exercise class, Type of exercise: walking, Time (Minutes): 15, Frequency (Times/Week): 7, Weekly Exercise (  Minutes/Week): 105, Intensity: Mild  Goals Addressed             This Visit's Progress    Stay Active and Independent       Why is this important?   Regular activity or exercise is important to managing back pain.  Activity helps to keep your muscles strong.  You will sleep better and feel more relaxed.  You will have more energy and feel less stressed.  If you are not active now, start slowly. Little changes make a big difference.  Rest, but not too much.  Stay as active as you can and listen to your body's signals.            Depression Screen    08/10/2022    3:41 PM 03/02/2022    2:20 PM 07/09/2021    2:51 PM 06/23/2020   10:48 AM 06/08/2019    2:49 PM 02/13/2019    2:29 PM 01/02/2018    3:24 PM  PHQ 2/9 Scores  PHQ - 2 Score 0 0 0 0 0 0 0  PHQ- 9 Score  0 0  0      Fall Risk    08/10/2022    3:43 PM 03/02/2022    2:20 PM 06/23/2020   10:46 AM 06/08/2019    2:51 PM 01/02/2018    3:24 PM  [place] in the past year? 0 0 0 0 No  Number falls in past yr: 0 0     Injury with Fall? 0 0     Risk for fall due to : No Fall Risks No Fall Risks     Follow up Falls evaluation completed Falls evaluation completed       [place]:  Any stairs in or around the home? Yes  If so, are there any without handrails? No  Home free of loose throw rugs in walkways, pet beds, electrical cords, etc? Yes  Adequate  lighting in your home to reduce risk of falls? Yes   ASSISTIVE DEVICES UTILIZED TO PREVENT FALLS:  Life alert? No  Use of a cane, walker or w/c? No  Grab bars in the bathroom? No  Shower chair or bench in shower? Yes  Elevated toilet seat or a handicapped toilet? No   TIMED UP AND GO:  Was the test performed? Yes .  Length of time to ambulate 10 feet: 10 sec.   Gait steady and fast without use of assistive device  Cognitive Function:        08/10/2022    3:45 PM  6CIT Screen  What Year? 0 points  What month? 0 points  What time? 0 points  Count back from 20 0 points  Months in reverse 0 points  Repeat phrase 0 points  Total Score 0 points    Immunizations Immunization History  Administered Date(s) Administered   Influenza,inj,Quad PF,6+ Mos 12/10/2016, 01/02/2018, 09/26/2019   PFIZER(Purple Top)SARS-COV-2 Vaccination 12/04/2019, 12/24/2019, 09/13/2020   Pneumococcal Conjugate-13 05/12/2015   Pneumococcal Polysaccharide-23 12/10/2016   Td 05/12/2015    TDAP status: Up to date  Flu Vaccine status: Completed at today's visit  Pneumococcal vaccine status: Up to date  Covid-19 vaccine status: Completed vaccines  Qualifies for Shingles Vaccine? Yes   Zostavax completed No   Shingrix Completed?: No.    Education has been provided regarding the importance of this vaccine. Patient has been advised to call insurance company to determine out of pocket expense  if they have not yet received this vaccine. Advised may also receive vaccine at local pharmacy or Health Dept. Verbalized acceptance and understanding.  Screening Tests Health Maintenance  Topic Date Due   Zoster Vaccines- Shingrix (1 of 2) Never done   COVID-19 Vaccine (4 - Pfizer series) 11/08/2020   INFLUENZA VACCINE  06/15/2022   HEMOGLOBIN A1C  09/01/2022   OPHTHALMOLOGY EXAM  12/21/2022   Diabetic kidney evaluation - GFR measurement  03/03/2023   Diabetic kidney evaluation - Urine ACR  03/03/2023   FOOT  EXAM  03/03/2023   Fecal DNA (Cologuard)  07/04/2023   TETANUS/TDAP  05/11/2025   Pneumonia Vaccine 39+ Years old  Completed   Hepatitis C Screening  Addressed   HPV VACCINES  Aged Out    Health Maintenance  Health Maintenance Due  Topic Date Due   Zoster Vaccines- Shingrix (1 of 2) Never done   COVID-19 Vaccine (4 - Pfizer series) 11/08/2020   INFLUENZA VACCINE  06/15/2022    Colorectal cancer screening: Type of screening: Cologuard. Completed 07/03/2020. Repeat every 3 years  Lung Cancer Screening: (Low Dose CT Chest recommended if Age 48-80 years, 30 pack-year currently smoking OR have quit w/in 15years.) does not qualify.     Additional Screening:  Hepatitis C Screening: does qualify; Completed 12/10/2016  Vision Screening: Recommended annual ophthalmology exams for early detection of glaucoma and other disorders of the eye. Is the patient up to date with their annual eye exam?  Yes  Who is the provider or what is the name of the office in which the patient attends annual eye exams? [facility]  If pt is not established with a provider, would they like to be referred to a provider to establish care? No .   Dental Screening: Recommended annual dental exams for proper oral hygiene  Community Resource Referral / Chronic Care Management: CRR required this visit?  No   CCM required this visit?  No      Plan:     I have personally reviewed and noted the following in the patient's chart:   Medical and social history Use of alcohol, tobacco or illicit drugs  Current medications and supplements including opioid prescriptions. Patient is not currently taking opioid prescriptions. Functional ability and status Nutritional status Physical activity Advanced directives List of other physicians Hospitalizations, surgeries, and ER visits in previous 12 months Vitals Screenings to include cognitive, depression, and falls Referrals and appointments  In addition, I have  reviewed and discussed with patient certain preventive protocols, quality metrics, and best practice recommendations. A written personalized care plan for preventive services as well as general preventive health recommendations were provided to patient.    Mr. Bosher , Thank you for taking time to come for your Medicare Wellness Visit. I appreciate your ongoing commitment to your health goals. Please review the following plan we discussed and let me know if I can assist you in the future.   These are the goals we discussed:  Goals      Stay Active and Independent     Why is this important?   Regular activity or exercise is important to managing back pain.  Activity helps to keep your muscles strong.  You will sleep better and feel more relaxed.  You will have more energy and feel less stressed.  If you are not active now, start slowly. Little changes make a big difference.  Rest, but not too much.  Stay as active as you can and  listen to your body's signals.             This is a list of the screening recommended for you and due dates:  Health Maintenance  Topic Date Due   Zoster (Shingles) Vaccine (1 of 2) Never done   COVID-19 Vaccine (4 - Pfizer series) 11/08/2020   Flu Shot  06/15/2022   Hemoglobin A1C  09/01/2022   Eye exam for diabetics  12/21/2022   Yearly kidney function blood test for diabetes  03/03/2023   Yearly kidney health urinalysis for diabetes  03/03/2023   Complete foot exam   03/03/2023   Cologuard (Stool DNA test)  07/04/2023   Tetanus Vaccine  05/11/2025   Pneumonia Vaccine  Completed   Hepatitis C Screening: USPSTF Recommendation to screen - Ages 63-79 yo.  Addressed   HPV Vaccine  Aged [address], [place]   08/10/2022   Nurse Notes:  Approximately 40 minute Face-to-Face Visit

## 2022-08-11 ENCOUNTER — Telehealth: Payer: Self-pay | Admitting: Internal Medicine

## 2022-08-11 NOTE — Telephone Encounter (Signed)
Left message for patient to call back and schedule the Medicare Annual Wellness Visit (AWV) virtually or by telephone.  Last AWV 07/09/21  Please schedule at anytime with [place].   Any questions, please call me at [phone]

## 2022-08-30 ENCOUNTER — Other Ambulatory Visit: Payer: Self-pay | Admitting: Internal Medicine

## 2022-08-31 NOTE — Telephone Encounter (Signed)
Requested Prescriptions  Pending Prescriptions Disp Refills  . ASPIRIN LOW DOSE 81 MG tablet [Pharmacy Med Name: ASPIRIN LOW DOSE 81 MG Tablet Delayed Release] 90 tablet 0    Sig: TAKE 1 TABLET EVERY DAY SWALLOW WHOLE     Analgesics:  NSAIDS - aspirin Passed - 08/30/2022 12:11 PM      Passed - Cr in normal range and within 360 days    Creat  Date Value Ref Range Status  03/02/2022 0.91 0.70 - 1.28 mg/dL Final   Creatinine, Urine  Date Value Ref Range Status  03/02/2022 76 20 - 320 mg/dL Final         Passed - eGFR is 10 or above and within 360 days    GFR  Date Value Ref Range Status  06/23/2020 61.60 >60.00 mL/min Final   eGFR  Date Value Ref Range Status  03/02/2022 90 > OR = 60 mL/min/1.28m Final    Comment:    The eGFR is based on the CKD-EPI 2021 equation. To calculate  the new eGFR from a previous Creatinine or Cystatin C result, go to https://www.kidney.org/professionals/ kdoqi/gfr%[email or web address]          Passed - Patient is not pregnant      Passed - Valid encounter within last 12 months    Recent Outpatient Visits          6 months ago Type 2 diabetes mellitus with hyperglycemia, without long-term current use of insulin ([facility]   [facility]   1 year ago Type 2 diabetes mellitus with hyperglycemia, without long-term current use of insulin ([facility]   [facility]   1 year ago Medicare annual wellness visit, subsequent   S[place] NP      Future Appointments            Tomorrow BGarnette Gun[facility] Ke[facility]

## 2022-09-01 ENCOUNTER — Ambulatory Visit (INDEPENDENT_AMBULATORY_CARE_PROVIDER_SITE_OTHER): Payer: Medicare HMO | Admitting: Internal Medicine

## 2022-09-01 ENCOUNTER — Encounter: Payer: Self-pay | Admitting: Internal Medicine

## 2022-09-01 VITALS — BP 124/80 | HR 88 | Temp 96.8°F | Ht 69.0 in | Wt 184.0 lb

## 2022-09-01 DIAGNOSIS — E663 Overweight: Secondary | ICD-10-CM | POA: Diagnosis not present

## 2022-09-01 DIAGNOSIS — Z125 Encounter for screening for malignant neoplasm of prostate: Secondary | ICD-10-CM | POA: Diagnosis not present

## 2022-09-01 DIAGNOSIS — Z0001 Encounter for general adult medical examination with abnormal findings: Secondary | ICD-10-CM

## 2022-09-01 DIAGNOSIS — Z6827 Body mass index (BMI) 27.0-27.9, adult: Secondary | ICD-10-CM | POA: Diagnosis not present

## 2022-09-01 DIAGNOSIS — E1165 Type 2 diabetes mellitus with hyperglycemia: Secondary | ICD-10-CM

## 2022-09-01 NOTE — Assessment & Plan Note (Signed)
Encourage diet and exercise for weight loss 

## 2022-09-01 NOTE — Progress Notes (Signed)
Subjective:    Patient ID: Charles Smith, male    DOB: 04-Aug-1951, 71 y.o.   MRN: [id number]  HPI  Patient presents to clinic today for his annual exam.  Flu: 07/2022 Tetanus: 04/2015 Pneumovax: 11/2016 Prevnar: 04/2015 Shingrix: Never Covid: Never PSA screening: 06/2021 Colon screening: 06/2020, Cologuard Vision screening: annually Dentist: biannually  Diet: He does eat meat. He consumes fruits and veggies. He does eat some fried foods. He drinks mostly water and unsweet tea. Exercise: Walking  Review of Systems  Past Medical History:  Diagnosis Date   History of chicken pox     Current Outpatient Medications  Medication Sig Dispense Refill   Ascorbic Acid (VITAMIN C) 1000 MG tablet Take 1,000 mg by mouth daily.     ASPIRIN LOW DOSE 81 MG tablet TAKE 1 TABLET EVERY DAY SWALLOW WHOLE 90 tablet 0   blood glucose meter kit and supplies Check blood sugar twice daily 1 each 0   ezetimibe (ZETIA) 10 MG tablet TAKE 1 TABLET EVERY DAY (SCHEDULE A FOLLOW UP APPOINTMENT PRIOR TO FURTHER REFILLS) 90 tablet 2   losartan (COZAAR) 100 MG tablet TAKE 1 TABLET EVERY DAY 90 tablet 0   metFORMIN (GLUCOPHAGE) 1000 MG tablet TAKE 1 TABLET (1,000 MG TOTAL) BY MOUTH 2 (TWO) TIMES DAILY WITH A MEAL. 180 tablet 0   Multiple Vitamins-Minerals (CENTRUM ADULTS PO) Take 1 capsule by mouth daily.     Omega-3 Fatty Acids (FISH OIL) 1000 MG CAPS Take 1,000 mg by mouth daily.      rosuvastatin (CRESTOR) 10 MG tablet TAKE 1 TABLET EVERY DAY 90 tablet 0   sildenafil (VIAGRA) 50 MG tablet Take 0.5 tablets (25 mg total) by mouth daily as needed for erectile dysfunction. 30 tablet 2   No current facility-administered medications for this visit.    No Known Allergies  Family History  Problem Relation Age of Onset   Hyperlipidemia Sister    Heart disease Brother        heart transplant   Kidney cancer Brother    Depression Neg Hx    Stroke Neg Hx     Social History   Socioeconomic History    Marital status: Divorced    Spouse name: Not on file   Number of children: 3   Years of education: Not on file   Highest education level: Not on file  Occupational History   Occupation: Retired  Tobacco Use   Smoking status: Former    Types: Cigarettes   Smokeless tobacco: Never  Vaping Use   Vaping Use: Never used  Substance and Sexual Activity   Alcohol use: Yes    Alcohol/week: 0.0 standard drinks of alcohol    Comment: occasional   Drug use: No   Sexual activity: Yes  Other Topics Concern   Not on file  Social History Narrative   Not on file   Social Determinants of Health   Financial Resource Strain: Low Risk  (08/10/2022)   Overall Financial Resource Strain (CARDIA)    Difficulty of Paying Living Expenses: Not hard at all  Food Insecurity: Not on file  Transportation Needs: No Transportation Needs (08/10/2022)   PRAPARE - Hydrologist (Medical): No    Lack of Transportation (Non-Medical): No  Physical Activity: Insufficiently Active (08/10/2022)   Exercise Vital Sign    Days of Exercise per Week: 7 days    Minutes of Exercise per Session: 20 min  Stress: No Stress Concern Present (08/10/2022)  [place] Questionnaire    Feeling of Stress : Not at all  Social Connections: Socially Isolated (08/10/2022)   Social Connection and Isolation Panel [NHANES]    Frequency of Communication with Friends and Family: More than three times a week    Frequency of Social Gatherings with Friends and Family: More than three times a week    Attends Religious Services: Never    Marine scientist or Organizations: No    Attends Archivist Meetings: Never    Marital Status: Divorced  Human resources officer Violence: Not At Risk (08/10/2022)   Humiliation, Afraid, Rape, and Kick questionnaire    Fear of Current or Ex-Partner: No    Emotionally Abused: No    Physically Abused: No    Sexually  Abused: No     Constitutional: Denies fever, malaise, fatigue, headache or abrupt weight changes.  HEENT: Denies eye pain, eye redness, ear pain, ringing in the ears, wax buildup, runny nose, nasal congestion, bloody nose, or sore throat. Respiratory: Denies difficulty breathing, shortness of breath, cough or sputum production.   Cardiovascular: Denies chest pain, chest tightness, palpitations or swelling in the hands or feet.  Gastrointestinal: Pt reports intermittent reflux. Denies abdominal pain, bloating, constipation, diarrhea or blood in the stool.  GU: Patient reports erectile dysfunction.  Denies urgency, frequency, pain with urination, burning sensation, blood in urine, odor or discharge. Musculoskeletal: Denies decrease in range of motion, difficulty with gait, muscle pain or joint pain and swelling.  Skin: Denies redness, rashes, lesions or ulcercations.  Neurological: Denies dizziness, difficulty with memory, difficulty with speech or problems with balance and coordination.  Psych: Denies anxiety, depression, SI/HI.  No other specific complaints in a complete review of systems (except as listed in HPI above).     Objective:   Physical Exam BP 124/80 (BP Location: Left Arm, Patient Position: Sitting, Cuff Size: Normal)   Pulse 88   Temp (!) 96.8 F (36 C) (Temporal)   Ht _0  (1.753 m)   Wt 184 lb (83.5 kg)   SpO2 98%   BMI 27.17 kg/m   Wt Readings from Last 3 Encounters:  08/10/22 184 lb 9.6 oz (83.7 kg)  03/02/22 171 lb (77.6 kg)  07/13/21 181 lb 3.2 oz (82.2 kg)    General: Appears his stated age, overweight, in NAD. Skin: Warm, dry and intact. No ulcerations noted. HEENT: Head: normal shape and size; Eyes: sclera white, no icterus, conjunctiva pink, PERRLA and EOMs intact;  Neck:  Neck supple, trachea midline. No masses, lumps or thyromegaly present.  Cardiovascular: Normal rate and rhythm. S1,S2 noted.  No murmur, rubs or gallops noted. No JVD or BLE edema. No  carotid bruits noted. Pulmonary/Chest: Normal effort and positive vesicular breath sounds. No respiratory distress. No wheezes, rales or ronchi noted.  Abdomen:  Normal bowel sounds. Musculoskeletal: Strength 5/5 BUE/BLE.  No difficulty with gait.  Neurological: Alert and oriented. Cranial nerves II-XII grossly intact. Coordination normal.  Psychiatric: Mood and affect normal. Behavior is normal. Judgment and thought content normal.     BMET    Component Value Date/Time   NA 140 03/02/2022 1354   K 4.5 03/02/2022 1354   CL 106 03/02/2022 1354   CO2 23 03/02/2022 1354   GLUCOSE 85 03/02/2022 1354   BUN 18 03/02/2022 1354   CREATININE 0.91 03/02/2022 1354   CALCIUM 9.4 03/02/2022 1354    Lipid Panel     Component Value Date/Time  CHOL 74 03/02/2022 1354   TRIG 139 03/02/2022 1354   HDL 45 03/02/2022 1354   CHOLHDL 1.6 03/02/2022 1354   VLDL 69.6 (H) 09/14/2019 1353   LDLCALC <10 03/02/2022 1354    CBC    Component Value Date/Time   WBC 11.0 (H) 07/09/2021 1452   RBC 5.46 07/09/2021 1452   HGB 16.2 07/09/2021 1452   HCT 49.6 07/09/2021 1452   PLT 263 07/09/2021 1452   MCV 90.8 07/09/2021 1452   MCH 29.7 07/09/2021 1452   MCHC 32.7 07/09/2021 1452   RDW 12.6 07/09/2021 1452    Hgb A1C Lab Results  Component Value Date   HGBA1C 5.9 (H) 03/02/2022            Assessment & Plan:   Preventative Health Maintenance:  Flu shot UTD Tetanus UTD Encouraged him to get his COVID-vaccine Pneumovax and Prevnar UTD Discussed Shingrix vaccine, he will check coverage with his insurance company and schedule a visit at the pharmacy if he would like to have this done Colon screening UTD Urged him to consume a balanced diet and exercise regimen Advised him to see an eye doctor and dentist annually We will check CBC, c-Met, lipid, A1c and PSA today  RTC in 6 months, follow-up chronic conditions Webb Silversmith, NP

## 2022-09-01 NOTE — Patient Instructions (Signed)
Health Maintenance After Age 72 After age 72, you are at a higher risk for certain long-term diseases and infections as well as injuries from falls. Falls are a major cause of broken bones and head injuries in people who are older than age 72. Getting regular preventive care can help to keep you healthy and well. Preventive care includes getting regular testing and making lifestyle changes as recommended by your health care provider. Talk with your health care provider about: Which screenings and tests you should have. A screening is a test that checks for a disease when you have no symptoms. A diet and exercise plan that is right for you. What should I know about screenings and tests to prevent falls? Screening and testing are the best ways to find a health problem early. Early diagnosis and treatment give you the best chance of managing medical conditions that are common after age 72. Certain conditions and lifestyle choices may make you more likely to have a fall. Your health care provider may recommend: Regular vision checks. Poor vision and conditions such as cataracts can make you more likely to have a fall. If you wear glasses, make sure to get your prescription updated if your vision changes. Medicine review. Work with your health care provider to regularly review all of the medicines you are taking, including over-the-counter medicines. Ask your health care provider about any side effects that may make you more likely to have a fall. Tell your health care provider if any medicines that you take make you feel dizzy or sleepy. Strength and balance checks. Your health care provider may recommend certain tests to check your strength and balance while standing, walking, or changing positions. Foot health exam. Foot pain and numbness, as well as not wearing proper footwear, can make you more likely to have a fall. Screenings, including: Osteoporosis screening. Osteoporosis is a condition that causes  the bones to get weaker and break more easily. Blood pressure screening. Blood pressure changes and medicines to control blood pressure can make you feel dizzy. Depression screening. You may be more likely to have a fall if you have a fear of falling, feel depressed, or feel unable to do activities that you used to do. Alcohol use screening. Using too much alcohol can affect your balance and may make you more likely to have a fall. Follow these instructions at home: Lifestyle Do not drink alcohol if: Your health care provider tells you not to drink. If you drink alcohol: Limit how much you have to: 0-1 drink a day for women. 0-2 drinks a day for men. Know how much alcohol is in your drink. In the [place] one drink equals one 12 oz bottle of beer (355 mL), one 5 oz glass of wine (148 mL), or one 1 oz glass of hard liquor (44 mL). Do not use any products that contain nicotine or tobacco. These products include cigarettes, chewing tobacco, and vaping devices, such as e-cigarettes. If you need help quitting, ask your health care provider. Activity  Follow a regular exercise program to stay fit. This will help you maintain your balance. Ask your health care provider what types of exercise are appropriate for you. If you need a cane or walker, use it as recommended by your health care provider. Wear supportive shoes that have nonskid soles. Safety  Remove any tripping hazards, such as rugs, cords, and clutter. Install safety equipment such as grab bars in bathrooms and safety rails on stairs. Keep rooms and walkways   well-lit. General instructions Talk with your health care provider about your risks for falling. Tell your health care provider if: You fall. Be sure to tell your health care provider about all falls, even ones that seem minor. You feel dizzy, tiredness (fatigue), or off-balance. Take over-the-counter and prescription medicines only as told by your health care provider. These include  supplements. Eat a healthy diet and maintain a healthy weight. A healthy diet includes low-fat dairy products, low-fat (lean) meats, and fiber from whole grains, beans, and lots of fruits and vegetables. Stay current with your vaccines. Schedule regular health, dental, and eye exams. Summary Having a healthy lifestyle and getting preventive care can help to protect your health and wellness after age 72. Screening and testing are the best way to find a health problem early and help you avoid having a fall. Early diagnosis and treatment give you the best chance for managing medical conditions that are more common for people who are older than age 72. Falls are a major cause of broken bones and head injuries in people who are older than age 72. Take precautions to prevent a fall at home. Work with your health care provider to learn what changes you can make to improve your health and wellness and to prevent falls. This information is not intended to replace advice given to you by your health care provider. Make sure you discuss any questions you have with your health care provider. Document Revised: 03/23/2021 Document Reviewed: 03/23/2021 Elsevier Patient Education  2023 Elsevier Inc.  

## 2022-09-02 LAB — COMPLETE METABOLIC PANEL WITH GFR
AG Ratio: 1.8 (calc) (ref 1.0–2.5)
ALT: 46 U/L (ref 9–46)
AST: 30 U/L (ref 10–35)
Albumin: 4.6 g/dL (ref 3.6–5.1)
Alkaline phosphatase (APISO): 53 U/L (ref 35–144)
BUN: 23 mg/dL (ref 7–25)
CO2: 25 mmol/L (ref 20–32)
Calcium: 10.1 mg/dL (ref 8.6–10.3)
Chloride: 104 mmol/L (ref 98–110)
Creat: 1.03 mg/dL (ref 0.70–1.28)
Globulin: 2.6 g/dL (calc) (ref 1.9–3.7)
Glucose, Bld: 111 mg/dL — ABNORMAL HIGH (ref 65–99)
Potassium: 4.5 mmol/L (ref 3.5–5.3)
Sodium: 139 mmol/L (ref 135–146)
Total Bilirubin: 1.5 mg/dL — ABNORMAL HIGH (ref 0.2–1.2)
Total Protein: 7.2 g/dL (ref 6.1–8.1)
eGFR: 77 mL/min/{1.73_m2} (ref >=60)

## 2022-09-02 LAB — LIPID PANEL
Cholesterol: 91 mg/dL (ref <200)
HDL: 40 mg/dL (ref >=40)
LDL Cholesterol (Calc): 19 mg/dL (calc)
Non-HDL Cholesterol (Calc): 51 mg/dL (calc) (ref <130)
Total CHOL/HDL Ratio: 2.3 (calc) (ref <5.0)
Triglycerides: 269 mg/dL — ABNORMAL HIGH (ref <150)

## 2022-09-02 LAB — CBC
HCT: 45.7 % (ref 38.5–50.0)
Hemoglobin: 15.5 g/dL (ref 13.2–17.1)
MCH: 29.9 pg (ref 27.0–33.0)
MCHC: 33.9 g/dL (ref 32.0–36.0)
MCV: 88.2 fL (ref 80.0–100.0)
MPV: 10.8 fL (ref 7.5–12.5)
Platelets: 268 10*3/uL (ref 140–400)
RBC: 5.18 10*6/uL (ref 4.20–5.80)
RDW: 13.6 % (ref 11.0–15.0)
WBC: 11.1 10*3/uL — ABNORMAL HIGH (ref 3.8–10.8)

## 2022-09-02 LAB — HEMOGLOBIN A1C
Hgb A1c MFr Bld: 6.3 % of total Hgb — ABNORMAL HIGH (ref <5.7)
Mean Plasma Glucose: 134 mg/dL
eAG (mmol/L): 7.4 mmol/L

## 2022-09-02 LAB — PSA: PSA: 1.04 ng/mL (ref <=4.00)

## 2022-09-26 ENCOUNTER — Other Ambulatory Visit: Payer: Self-pay | Admitting: Internal Medicine

## 2022-09-27 NOTE — Telephone Encounter (Signed)
Requested Prescriptions  Pending Prescriptions Disp Refills   losartan (COZAAR) 100 MG tablet [Pharmacy Med Name: LOSARTAN POTASSIUM 100 MG Tablet] 90 tablet 0    Sig: TAKE 1 TABLET EVERY DAY     Cardiovascular:  Angiotensin Receptor Blockers Passed - 09/26/2022  2:40 AM      Passed - Cr in normal range and within 180 days    Creat  Date Value Ref Range Status  09/01/2022 1.03 0.70 - 1.28 mg/dL Final   Creatinine, Urine  Date Value Ref Range Status  03/02/2022 76 20 - 320 mg/dL Final         Passed - K in normal range and within 180 days    Potassium  Date Value Ref Range Status  09/01/2022 4.5 3.5 - 5.3 mmol/L Final         Passed - Patient is not pregnant      Passed - Last BP in normal range    BP Readings from Last 1 Encounters:  09/01/22 124/80         Passed - Valid encounter within last 6 months    Recent Outpatient Visits           3 weeks ago Encounter for general adult medical examination with abnormal findings   [facility] [place], Coralie Keens, NP   6 months ago Type 2 diabetes mellitus with hyperglycemia, without long-term current use of insulin [facility])   [facility] [place], Coralie Keens, NP   1 year ago Type 2 diabetes mellitus with hyperglycemia, without long-term current use of insulin [facility])   [facility] [place], Coralie Keens, NP   1 year ago Medicare annual wellness visit, subsequent   [facility] [place], Coralie Keens, NP

## 2022-10-03 ENCOUNTER — Other Ambulatory Visit: Payer: Self-pay | Admitting: Internal Medicine

## 2022-10-04 NOTE — Telephone Encounter (Signed)
Requested Prescriptions  Pending Prescriptions Disp Refills   rosuvastatin (CRESTOR) 10 MG tablet [Pharmacy Med Name: ROSUVASTATIN CALCIUM 10 MG Tablet] 90 tablet 3    Sig: TAKE 1 TABLET EVERY DAY (NEED MD APPOINTMENT FOR REFILLS)     Cardiovascular:  Antilipid - Statins 2 Failed - 10/03/2022  4:24 AM      Failed - Lipid Panel in normal range within the last 12 months    Cholesterol  Date Value Ref Range Status  09/01/2022 91 <200 mg/dL Final   LDL Cholesterol (Calc)  Date Value Ref Range Status  09/01/2022 19 mg/dL (calc) Final    Comment:    Reference range: <100 . Desirable range <100 mg/dL for primary prevention;   <70 mg/dL for patients with CHD or diabetic patients  with > or = 2 CHD risk factors. Marland Kitchen LDL-C is now calculated using the Martin-Hopkins  calculation, which is a validated novel method providing  better accuracy than the Friedewald equation in the  estimation of LDL-C.  Cresenciano Genre et al. Annamaria Helling. 8299;371(69): 2061-2068  (http://education.[facility].com/faq/FAQ164)    Direct LDL  Date Value Ref Range Status  06/23/2020 40.0 mg/dL Final    Comment:    Optimal:  <100 mg/dLNear or Above Optimal:  100-129 mg/dLBorderline High:  130-159 mg/dLHigh:  160-189 mg/dLVery High:  >190 mg/dL   HDL  Date Value Ref Range Status  09/01/2022 40 > OR = 40 mg/dL Final   Triglycerides  Date Value Ref Range Status  09/01/2022 269 (H) <150 mg/dL Final    Comment:    . If a non-fasting specimen was collected, consider repeat triglyceride testing on a fasting specimen if clinically indicated.  Yates Decamp et al. J. of Clin. Lipidol. 6789;3:810-175. Marland Kitchen          Passed - Cr in normal range and within 360 days    Creat  Date Value Ref Range Status  09/01/2022 1.03 0.70 - 1.28 mg/dL Final   Creatinine, Urine  Date Value Ref Range Status  03/02/2022 76 20 - 320 mg/dL Final         Passed - Patient is not pregnant      Passed - Valid encounter within last 12 months     Recent Outpatient Visits           1 month ago Encounter for general adult medical examination with abnormal findings   [facility] [place], Coralie Keens, NP   7 months ago Type 2 diabetes mellitus with hyperglycemia, without long-term current use of insulin [facility])   [facility] [place], Coralie Keens, NP   1 year ago Type 2 diabetes mellitus with hyperglycemia, without long-term current use of insulin [facility])   [facility] [place], Coralie Keens, NP   1 year ago Medicare annual wellness visit, subsequent   [facility] [place], Coralie Keens, NP

## 2022-11-12 ENCOUNTER — Ambulatory Visit: Payer: Self-pay | Admitting: *Deleted

## 2022-11-12 NOTE — Telephone Encounter (Signed)
  Chief Complaint: Rash on upper back and chest and down both of his sides Symptoms: Itching especially on his back.     Using Hydrocortisone cream but the rash is still there. Frequency: N/A Pertinent Negatives: Patient denies knowing what is causing it.   Thought it was new soap but has stopped it and the rash persists. Disposition: '[]'$ ED /'[]'$ Urgent Care (no appt availability in office) / '[x]'$ Appointment(In office/virtual)/ '[]'$  [place] [facility]/ '[]'$ Home Care/ '[]'$ Refused Recommended Disposition /'[]'$ [place] [facility]/ '[]'$  Follow-up with PCP Additional Notes: Appt. Made with Webb Silversmith, NP for 11/16/2022 at 9:40.    I instructed him to go to the urgent care over the New Year's holiday if it spread or became worse.  I went over the care advice.   He is going to try an OTC antihistamine since he hasn't tried that.

## 2022-11-12 NOTE — Telephone Encounter (Signed)
Message from Shan Levans sent at 11/12/2022  9:55 AM EST  Summary: Rash   Patient states that he has had a rash on most of his upper body for about a week. No appointments available until next week.          Call History   Type Contact Phone/Fax User  11/12/2022 09:53 AM EST Phone (Incoming) Smith, Charles Robicheaux (Self) [phone] (H) Mabe, [place]   Reason for Disposition  SEVERE itching (i.e., interferes with sleep, normal activities or school)  Answer Assessment - Initial Assessment Questions 1. APPEARANCE of RASH: "Describe the rash." (e.g., spots, blisters, raised areas, skin peeling, scaly)     I switched soaps and thought it was that.   I stopped it 4-5 days ago.   Still have the rash. No burning or pain.  I went to using my regular soap.  It's little bumps and red looking.  2. SIZE: "How big are the spots?" (e.g., tip of pen, eraser, coin; inches, centimeters)     Like pencil dots  3. LOCATION: "Where is the rash located?"     Rash upper back and chest and side of both sides of my stomach.   4. COLOR: "What color is the rash?" (Note: It is difficult to assess rash color in people with darker-colored skin. When this situation occurs, simply ask the caller to describe what they see.)     Red dots  I have bought some of the 1% Hydrocortisone OTC.   It helps but my back still itches real bad.   It's hard to reach my back.  5. ONSET: "When did the rash begin?"      A week ago.    I thought it was dry skin then I switched to Dial soap.   That's when it started but it's still there even though I've stopped using the Dial soap 4-5 days ago.   6. FEVER: "Do you have a fever?" If Yes, ask: "What is your temperature, how was it measured, and when did it start?"     Not asked 7. ITCHING: "Does the rash itch?" If Yes, ask: "How bad is the itch?" (Scale 1-10; or mild, moderate, severe)      Yes real bad.     I had a stomach virus that lasted 24 hours this past weekend.   It  resolved on its own.  I didn't take any medications for it.  8. CAUSE: "What do you think is causing the rash?"     I don't know.    I stopped the Dial soap. 9. MEDICINE FACTORS: "Have you started any new medicines within the last 2 weeks?" (e.g., antibiotics)      No changes 10. OTHER SYMPTOMS: "Do you have any other symptoms?" (e.g., dizziness, headache, sore throat, joint pain)       It's gotten a little better today but not much.   Still itches real bad especially on my back.   I'm using a back scratcher.   11. PREGNANCY: "Is there any chance you are pregnant?" "When was your last menstrual period?"       N/A  Protocols used: Rash or Redness - [facility]

## 2022-11-16 ENCOUNTER — Encounter: Payer: Self-pay | Admitting: Internal Medicine

## 2022-11-16 ENCOUNTER — Ambulatory Visit (INDEPENDENT_AMBULATORY_CARE_PROVIDER_SITE_OTHER): Payer: Medicare HMO | Admitting: Internal Medicine

## 2022-11-16 VITALS — BP 132/78 | HR 88 | Temp 96.9°F | Wt 183.0 lb

## 2022-11-16 DIAGNOSIS — L239 Allergic contact dermatitis, unspecified cause: Secondary | ICD-10-CM

## 2022-11-16 MED ORDER — PREDNISONE 10 MG PO TABS: ORAL_TABLET | ORAL | 0 refills | Status: DC

## 2022-11-16 NOTE — Progress Notes (Signed)
Subjective:    Patient ID: Charles Smith, male    DOB: 1951/04/17, 72 y.o.   MRN: [id number]  HPI  Patient presents to the clinic today with complaint of a rash.  He noticed this 1 week ago.  The rash is located on his chest and upper back.  The rash itches.  He thought it was because he changed soaps but he has changed back and not had any improvement in his symptoms.  He has been taking Zyrtrec using Hydrocortisone cream OTC with minimal relief of symptoms. No one in his home has a similar rash.  Review of Systems    Past Medical History:  Diagnosis Date   History of chicken pox     Current Outpatient Medications  Medication Sig Dispense Refill   Ascorbic Acid (VITAMIN C) 1000 MG tablet Take 1,000 mg by mouth daily.     ASPIRIN LOW DOSE 81 MG tablet TAKE 1 TABLET EVERY DAY SWALLOW WHOLE 90 tablet 0   blood glucose meter kit and supplies Check blood sugar twice daily 1 each 0   ezetimibe (ZETIA) 10 MG tablet TAKE 1 TABLET EVERY DAY (SCHEDULE A FOLLOW UP APPOINTMENT PRIOR TO FURTHER REFILLS) 90 tablet 2   losartan (COZAAR) 100 MG tablet TAKE 1 TABLET EVERY DAY 90 tablet 0   metFORMIN (GLUCOPHAGE) 1000 MG tablet TAKE 1 TABLET (1,000 MG TOTAL) BY MOUTH 2 (TWO) TIMES DAILY WITH A MEAL. 180 tablet 0   Multiple Vitamins-Minerals (CENTRUM ADULTS PO) Take 1 capsule by mouth daily.     Omega-3 Fatty Acids (FISH OIL) 1000 MG CAPS Take 1,000 mg by mouth daily.      rosuvastatin (CRESTOR) 10 MG tablet TAKE 1 TABLET EVERY DAY (NEED MD APPOINTMENT FOR REFILLS) 90 tablet 3   sildenafil (VIAGRA) 50 MG tablet Take 0.5 tablets (25 mg total) by mouth daily as needed for erectile dysfunction. 30 tablet 2   No current facility-administered medications for this visit.    No Known Allergies  Family History  Problem Relation Age of Onset   Hyperlipidemia Sister    Heart disease Brother        heart transplant   Kidney cancer Brother    Depression Neg Hx    Stroke Neg Hx     Social History    Socioeconomic History   Marital status: Divorced    Spouse name: Not on file   Number of children: 3   Years of education: Not on file   Highest education level: Not on file  Occupational History   Occupation: Retired  Tobacco Use   Smoking status: Former    Types: Cigarettes   Smokeless tobacco: Never  Vaping Use   Vaping Use: Never used  Substance and Sexual Activity   Alcohol use: Yes    Alcohol/week: 0.0 standard drinks of alcohol    Comment: occasional   Drug use: No   Sexual activity: Yes  Other Topics Concern   Not on file  Social History Narrative   Not on file   Social Determinants of Health   Financial Resource Strain: Low Risk  (08/10/2022)   Overall Financial Resource Strain (CARDIA)    Difficulty of Paying Living Expenses: Not hard at all  Food Insecurity: Not on file  Transportation Needs: No Transportation Needs (08/10/2022)   PRAPARE - Hydrologist (Medical): No    Lack of Transportation (Non-Medical): No  Physical Activity: Insufficiently Active (08/10/2022)   Exercise Vital Sign  Days of Exercise per Week: 7 days    Minutes of Exercise per Session: 20 min  Stress: No Stress Concern Present (08/10/2022)   Tyler    Feeling of Stress : Not at all  Social Connections: Socially Isolated (08/10/2022)   Social Connection and Isolation Panel [NHANES]    Frequency of Communication with Friends and Family: More than three times a week    Frequency of Social Gatherings with Friends and Family: More than three times a week    Attends Religious Services: Never    Marine scientist or Organizations: No    Attends Archivist Meetings: Never    Marital Status: Divorced  Human resources officer Violence: Not At Risk (08/10/2022)   Humiliation, Afraid, Rape, and Kick questionnaire    Fear of Current or Ex-Partner: No    Emotionally Abused: No    Physically  Abused: No    Sexually Abused: No     Constitutional: Denies fever, malaise, fatigue, headache or abrupt weight changes.  HEENT: Denies eye pain, eye redness, ear pain, ringing in the ears, wax buildup, runny nose, nasal congestion, bloody nose, or sore throat. Respiratory: Denies difficulty breathing, shortness of breath, cough or sputum production.   Cardiovascular: Denies chest pain, chest tightness, palpitations or swelling in the hands or feet.  Musculoskeletal: Denies decrease in range of motion, difficulty with gait, muscle pain or joint pain and swelling.  Skin: Patient reports rash.  Denies ulcercations.   No other specific complaints in a complete review of systems (except as listed in HPI above).  Objective:   Physical Exam  BP 132/78 (BP Location: Right Arm, Patient Position: Sitting, Cuff Size: Normal)   Pulse 88   Temp (!) 96.9 F (36.1 C) (Temporal)   Wt 183 lb (83 kg)   SpO2 99%   BMI 27.02 kg/m   Wt Readings from Last 3 Encounters:  09/01/22 184 lb (83.5 kg)  08/10/22 184 lb 9.6 oz (83.7 kg)  03/02/22 171 lb (77.6 kg)    General: Appears his stated age, overweight, in NAD. Skin:, Last set maculopapular rash noted of upper chest and back. HEENT: Head: normal shape and size; Eyes: sclera white, no icterus, conjunctiva pink, PERRLA and EOMs intact;  Cardiovascular: Normal rate and rhythm. Pulmonary/Chest: Normal effort and positive vesicular breath sounds. No respiratory distress. No wheezes, rales or ronchi noted.  Neurological: Alert and oriented.     BMET    Component Value Date/Time   NA 139 09/01/2022 0953   K 4.5 09/01/2022 0953   CL 104 09/01/2022 0953   CO2 25 09/01/2022 0953   GLUCOSE 111 (H) 09/01/2022 0953   BUN 23 09/01/2022 0953   CREATININE 1.03 09/01/2022 0953   CALCIUM 10.1 09/01/2022 0953    Lipid Panel     Component Value Date/Time   CHOL 91 09/01/2022 0953   TRIG 269 (H) 09/01/2022 0953   HDL 40 09/01/2022 0953   CHOLHDL 2.3  09/01/2022 0953   VLDL 69.6 (H) 09/14/2019 1353   LDLCALC 19 09/01/2022 0953    CBC    Component Value Date/Time   WBC 11.1 (H) 09/01/2022 0953   RBC 5.18 09/01/2022 0953   HGB 15.5 09/01/2022 0953   HCT 45.7 09/01/2022 0953   PLT 268 09/01/2022 0953   MCV 88.2 09/01/2022 0953   MCH 29.9 09/01/2022 0953   MCHC 33.9 09/01/2022 0953   RDW 13.6 09/01/2022 0953    Hgb  A1C Lab Results  Component Value Date   HGBA1C 6.3 (H) 09/01/2022            Assessment & Plan:   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

## 2022-11-16 NOTE — Patient Instructions (Signed)
Atopic Dermatitis ?Atopic dermatitis is a skin disorder that causes inflammation of the skin. It is marked by a red rash and itchy, dry, scaly skin. It is the most common type of eczema. Eczema is a group of skin conditions that cause the skin to become rough and swollen. This condition is generally worse during the cooler winter months and often improves during the warm summer months. ?Atopic dermatitis usually starts showing signs in infancy and can last through adulthood. This condition cannot be passed from one person to another (is not contagious). Atopic dermatitis may not always be present, but when it is, it is called a flare-up. ?What are the causes? ?The exact cause of this condition is not known. Flare-ups may be triggered by: ?Coming in contact with something that you are sensitive or allergic to (allergen). ?Stress. ?Certain foods. ?Extremely hot or cold weather. ?Harsh chemicals and soaps. ?Dry air. ?Chlorine. ?What increases the risk? ?This condition is more likely to develop in people who have a personal or family history of: ?Eczema. ?Allergies. ?Asthma. ?Hay fever. ?What are the signs or symptoms? ?Symptoms of this condition include: ?Dry, scaly skin. ?Red, itchy rash. ?Itchiness, which can be severe. This may occur before the skin rash. This can make sleeping difficult. ?Skin thickening and cracking that can occur over time. ?How is this diagnosed? ?This condition is diagnosed based on: ?Your symptoms. ?Your medical history. ?A physical exam. ?How is this treated? ?There is no cure for this condition, but symptoms can usually be controlled. Treatment focuses on: ?Controlling the itchiness and scratching. You may be given medicines, such as antihistamines or steroid creams. ?Limiting exposure to allergens. ?Recognizing situations that cause stress and developing a plan to manage stress. ?If your atopic dermatitis does not get better with medicines, or if it is all over your body (widespread), a  treatment using a specific type of light (phototherapy) may be used. ?Follow these instructions at home: ?Skin care ? ?Keep your skin well moisturized. Doing this seals in moisture and helps to prevent dryness. ?Use unscented lotions that have petroleum in them. ?Avoid lotions that contain alcohol or water. They can dry the skin. ?Keep baths or showers short (less than 5 minutes) in warm water. Do not use hot water. ?Use mild, unscented cleansers for bathing. Avoid soap and bubble bath. ?Apply a moisturizer to your skin right after a bath or shower. ?Do not apply anything to your skin without checking with your health care provider. ?General instructions ?Take or apply over-the-counter and prescription medicines only as told by your health care provider. ?Dress in clothes made of cotton or cotton blends. Dress lightly because heat increases itchiness. ?When washing your clothes, rinse your clothes twice so all of the soap is removed. ?Avoid any triggers that can cause a flare-up. ?Keep your fingernails cut short. ?Avoid scratching. Scratching makes the rash and itchiness worse. A break in the skin from scratching could result in a skin infection (impetigo). ?Do not be around people who have cold sores or fever blisters. If you get the infection, it may cause your atopic dermatitis to worsen. ?Keep all follow-up visits. This is important. ?Contact a health care provider if: ?Your itchiness interferes with sleep. ?Your rash gets worse or is not better within one week of starting treatment. ?You have a fever. ?You have a rash flare-up after having contact with someone who has cold sores or fever blisters. ?Get help right away if: ?You develop pus or soft yellow scabs in the rash   area. ?Summary ?Atopic dermatitis causes a red rash and itchy, dry, scaly skin. ?Treatment focuses on controlling the itchiness and scratching, limiting exposure to things that you are sensitive or allergic to (allergens), recognizing  situations that cause stress, and developing a plan to manage stress. ?Keep your skin well moisturized. ?Keep baths or showers shorter than 5 minutes and use warm water. Do not use hot water. ?This information is not intended to replace advice given to you by your health care provider. Make sure you discuss any questions you have with your health care provider. ?Document Revised: 08/11/2020 Document Reviewed: 08/11/2020 ?Elsevier Patient Education ? 2023 Elsevier Inc. ? ?

## 2022-11-18 DIAGNOSIS — H35371 Puckering of macula, right eye: Secondary | ICD-10-CM | POA: Diagnosis not present

## 2022-11-18 DIAGNOSIS — Z0100 Encounter for examination of eyes and vision without abnormal findings: Secondary | ICD-10-CM | POA: Diagnosis not present

## 2022-11-18 LAB — HM DIABETES EYE EXAM

## 2022-11-19 ENCOUNTER — Encounter: Payer: Self-pay | Admitting: Internal Medicine

## 2022-11-28 ENCOUNTER — Other Ambulatory Visit: Payer: Self-pay | Admitting: Internal Medicine

## 2022-11-29 NOTE — Telephone Encounter (Signed)
Requested Prescriptions  Pending Prescriptions Disp Refills   metFORMIN (GLUCOPHAGE) 1000 MG tablet [Pharmacy Med Name: METFORMIN HYDROCHLORIDE 1000 MG Tablet] 180 tablet 1    Sig: TAKE 1 TABLET TWICE DAILY WITH MEALS     Endocrinology:  Diabetes - Biguanides Failed - 11/28/2022  3:13 AM      Failed - B12 Level in normal range and within 720 days    No results found for: "VITAMINB12"       Failed - CBC within normal limits and completed in the last 12 months    WBC  Date Value Ref Range Status  09/01/2022 11.1 (H) 3.8 - 10.8 Thousand/uL Final   RBC  Date Value Ref Range Status  09/01/2022 5.18 4.20 - 5.80 Million/uL Final   Hemoglobin  Date Value Ref Range Status  09/01/2022 15.5 13.2 - 17.1 g/dL Final   HCT  Date Value Ref Range Status  09/01/2022 45.7 38.5 - 50.0 % Final   MCHC  Date Value Ref Range Status  09/01/2022 33.9 32.0 - 36.0 g/dL Final   [facility]  Date Value Ref Range Status  09/01/2022 29.9 27.0 - 33.0 pg Final   MCV  Date Value Ref Range Status  09/01/2022 88.2 80.0 - 100.0 fL Final   No results found for: "PLTCOUNTKUC", "LABPLAT", "POCPLA" RDW  Date Value Ref Range Status  09/01/2022 13.6 11.0 - 15.0 % Final         Passed - Cr in normal range and within 360 days    Creat  Date Value Ref Range Status  09/01/2022 1.03 0.70 - 1.28 mg/dL Final   Creatinine, Urine  Date Value Ref Range Status  03/02/2022 76 20 - 320 mg/dL Final         Passed - HBA1C is between 0 and 7.9 and within 180 days    Hgb A1c MFr Bld  Date Value Ref Range Status  09/01/2022 6.3 (H) <5.7 % of total Hgb Final    Comment:    For someone without known diabetes, a hemoglobin  A1c value between 5.7% and 6.4% is consistent with prediabetes and should be confirmed with a  follow-up test. . For someone with known diabetes, a value <7% indicates that their diabetes is well controlled. A1c targets should be individualized based on duration of diabetes, age, comorbid conditions,  and other considerations. . This assay result is consistent with an increased risk of diabetes. . Currently, no consensus exists regarding use of hemoglobin A1c for diagnosis of diabetes for children. .          Passed - eGFR in normal range and within 360 days    GFR  Date Value Ref Range Status  06/23/2020 61.60 >60.00 mL/min Final   eGFR  Date Value Ref Range Status  09/01/2022 77 > OR = 60 mL/min/1.74m Final         Passed - Valid encounter within last 6 months    Recent Outpatient Visits           1 week ago [facility] dermat[facility]   [facility]   2 months ago Encounter for general adult medical examination with abnormal findings   [facility]   9 months ago Type 2 diabetes mellitus with hyperglycemia, without long-term current use of insulin ([facility] [place]   [facility]   1 year ago Type 2 diabetes mellitus with hyperglycemia, without long-term current use of  insulin [facility])   [facility] [place], Coralie Keens, NP   1 year ago Medicare annual wellness visit, subsequent   [facility] [place], Coralie Keens, [place]

## 2022-12-09 ENCOUNTER — Other Ambulatory Visit: Payer: Self-pay | Admitting: Internal Medicine

## 2022-12-09 NOTE — Telephone Encounter (Signed)
Requested Prescriptions  Pending Prescriptions Disp Refills   losartan (COZAAR) 100 MG tablet [Pharmacy Med Name: LOSARTAN POTASSIUM 100 MG Tablet] 90 tablet 0    Sig: TAKE 1 TABLET EVERY DAY     Cardiovascular:  Angiotensin Receptor Blockers Passed - 12/09/2022  2:55 AM      Passed - Cr in normal range and within 180 days    Creat  Date Value Ref Range Status  09/01/2022 1.03 0.70 - 1.28 mg/dL Final   Creatinine, Urine  Date Value Ref Range Status  03/02/2022 76 20 - 320 mg/dL Final         Passed - K in normal range and within 180 days    Potassium  Date Value Ref Range Status  09/01/2022 4.5 3.5 - 5.3 mmol/L Final         Passed - Patient is not pregnant      Passed - Last BP in normal range    BP Readings from Last 1 Encounters:  11/16/22 132/78         Passed - Valid encounter within last 6 months    Recent Outpatient Visits           3 weeks ago Allergic dermatitis   [facility] [place], Coralie Keens, NP   3 months ago Encounter for general adult medical examination with abnormal findings   [facility] [place], [place] W, NP   9 months ago Type 2 diabetes mellitus with hyperglycemia, without long-term current use of insulin [facility])   [facility] [place], [place] W, NP   1 year ago Type 2 diabetes mellitus with hyperglycemia, without long-term current use of insulin [facility])   [facility] [place], Coralie Keens, NP   1 year ago Medicare annual wellness visit, subsequent   [facility] [place], Coralie Keens, [place]

## 2023-02-20 ENCOUNTER — Other Ambulatory Visit: Payer: Self-pay | Admitting: Internal Medicine

## 2023-02-21 NOTE — Telephone Encounter (Signed)
Requested Prescriptions  Pending Prescriptions Disp Refills   losartan (COZAAR) 100 MG tablet [Pharmacy Med Name: LOSARTAN POTASSIUM 100 MG Tablet] 90 tablet 0    Sig: TAKE 1 TABLET EVERY DAY     Cardiovascular:  Angiotensin Receptor Blockers Passed - 02/20/2023  5:10 AM      Passed - Cr in normal range and within 180 days    Creat  Date Value Ref Range Status  09/01/2022 1.03 0.70 - 1.28 mg/dL Final   Creatinine, Urine  Date Value Ref Range Status  03/02/2022 76 20 - 320 mg/dL Final         Passed - K in normal range and within 180 days    Potassium  Date Value Ref Range Status  09/01/2022 4.5 3.5 - 5.3 mmol/L Final         Passed - Patient is not pregnant      Passed - Last BP in normal range    BP Readings from Last 1 Encounters:  11/16/22 132/78         Passed - Valid encounter within last 6 months    Recent Outpatient Visits           3 months ago Allergic dermatitis   [place] [facility] [place], Salvadore Oxford, NP   5 months ago Encounter for general adult medical examination with abnormal findings   [facility] [place], [place], NP   11 months ago Type 2 diabetes mellitus with hyperglycemia, without long-term current use of insulin [facility])   [facility] [place], [place] W, NP   1 year ago Type 2 diabetes mellitus with hyperglycemia, without long-term current use of insulin [facility])   [place] [facility] [place], Salvadore Oxford, NP   1 year ago Medicare annual wellness visit, subsequent   [facility] [place], Salvadore Oxford, [place]

## 2023-03-06 ENCOUNTER — Other Ambulatory Visit: Payer: Self-pay | Admitting: Internal Medicine

## 2023-03-07 NOTE — Telephone Encounter (Signed)
Requested Prescriptions  Pending Prescriptions Disp Refills   ezetimibe (ZETIA) 10 MG tablet [Pharmacy Med Name: EZETIMIBE 10 MG Tablet] 90 tablet 1    Sig: TAKE 1 TABLET EVERY DAY (SCHEDULE A FOLLOW UP APPOINTMENT PRIOR TO FURTHER REFILLS)     Cardiovascular:  Antilipid - Sterol Transport Inhibitors Failed - 03/06/2023  1:55 AM      Failed - Lipid Panel in normal range within the last 12 months    Cholesterol  Date Value Ref Range Status  09/01/2022 91 <200 mg/dL Final   LDL Cholesterol (Calc)  Date Value Ref Range Status  09/01/2022 19 mg/dL (calc) Final    Comment:    Reference range: <100 . Desirable range <100 mg/dL for primary prevention;   <70 mg/dL for patients with CHD or diabetic patients  with > or = 2 CHD risk factors. Marland Kitchen LDL-C is now calculated using the Martin-Hopkins  calculation, which is a validated novel method providing  better accuracy than the Friedewald equation in the  estimation of LDL-C.  Horald Pollen et al. Lenox Ahr. 1610;960(45): 2061-2068  (http://education.QuestDiagnostics.com/faq/FAQ164)    Direct LDL  Date Value Ref Range Status  06/23/2020 40.0 mg/dL Final    Comment:    Optimal:  <100 mg/dLNear or Above Optimal:  100-129 mg/dLBorderline High:  130-159 mg/dLHigh:  160-189 mg/dLVery High:  >190 mg/dL   HDL  Date Value Ref Range Status  09/01/2022 40 > OR = 40 mg/dL Final   Triglycerides  Date Value Ref Range Status  09/01/2022 269 (H) <150 mg/dL Final    Comment:    . If a non-fasting specimen was collected, consider repeat triglyceride testing on a fasting specimen if clinically indicated.  Perry Mount et al. J. of Clin. Lipidol. 2015;9:129-169. Marland Kitchen          Passed - AST in normal range and within 360 days    AST  Date Value Ref Range Status  09/01/2022 30 10 - 35 U/L Final         Passed - ALT in normal range and within 360 days    ALT  Date Value Ref Range Status  09/01/2022 46 9 - 46 U/L Final         Passed - Patient is not  pregnant      Passed - Valid encounter within last 12 months    Recent Outpatient Visits           3 months ago Allergic dermatitis   [place] [facility] [place], Salvadore Oxford, NP   6 months ago Encounter for general adult medical examination with abnormal findings   [place] [facility] [place], [place] W, NP   1 year ago Type 2 diabetes mellitus with hyperglycemia, without long-term current use of insulin [facility])   [facility] [place], [place] W, NP   1 year ago Type 2 diabetes mellitus with hyperglycemia, without long-term current use of insulin [facility])   [place] [place], Salvadore Oxford, NP   1 year ago Medicare annual wellness visit, subsequent   [facility] [place], Salvadore Oxford, [place]

## 2023-03-25 ENCOUNTER — Other Ambulatory Visit: Payer: Self-pay | Admitting: Internal Medicine

## 2023-03-25 NOTE — Telephone Encounter (Signed)
Requested Prescriptions  Pending Prescriptions Disp Refills   sildenafil (VIAGRA) 50 MG tablet [Pharmacy Med Name: SILDENAFIL CITRATE 50 MG Tablet] 90 tablet 0    Sig: TAKE 1/2 TABLET (25 MG TOTAL) BY MOUTH DAILY AS NEEDED FOR ERECTILE DYSFUNCTION.     Urology: Erectile Dysfunction Agents Passed - 03/25/2023 10:49 AM      Passed - AST in normal range and within 360 days    AST  Date Value Ref Range Status  09/01/2022 30 10 - 35 U/L Final         Passed - ALT in normal range and within 360 days    ALT  Date Value Ref Range Status  09/01/2022 46 9 - 46 U/L Final         Passed - Last BP in normal range    BP Readings from Last 1 Encounters:  11/16/22 132/78         Passed - Valid encounter within last 12 months    Recent Outpatient Visits           4 months ago Allergic dermatitis   [place] [facility] [place], Salvadore Oxford, NP   6 months ago Encounter for general adult medical examination with abnormal findings   [facility] [place], [place] W, NP   1 year ago Type 2 diabetes mellitus with hyperglycemia, without long-term current use of insulin [facility])   [place] [facility] [place], [place] W, NP   1 year ago Type 2 diabetes mellitus with hyperglycemia, without long-term current use of insulin [facility])   [place] [place], Salvadore Oxford, NP   1 year ago Medicare annual wellness visit, subsequent   [facility] [place], Salvadore Oxford, [place]

## 2023-04-14 ENCOUNTER — Encounter: Payer: Self-pay | Admitting: Internal Medicine

## 2023-05-05 ENCOUNTER — Other Ambulatory Visit: Payer: Self-pay | Admitting: Internal Medicine

## 2023-05-05 NOTE — Telephone Encounter (Signed)
Requested Prescriptions  Pending Prescriptions Disp Refills   losartan (COZAAR) 100 MG tablet [Pharmacy Med Name: LOSARTAN POTASSIUM 100 MG Tablet] 90 tablet 0    Sig: TAKE 1 TABLET EVERY DAY     Cardiovascular:  Angiotensin Receptor Blockers Failed - 05/05/2023  4:05 AM      Failed - Cr in normal range and within 180 days    Creat  Date Value Ref Range Status  09/01/2022 1.03 0.70 - 1.28 mg/dL Final   Creatinine, Urine  Date Value Ref Range Status  03/02/2022 76 20 - 320 mg/dL Final         Failed - K in normal range and within 180 days    Potassium  Date Value Ref Range Status  09/01/2022 4.5 3.5 - 5.3 mmol/L Final         Passed - Patient is not pregnant      Passed - Last BP in normal range    BP Readings from Last 1 Encounters:  11/16/22 132/78         Passed - Valid encounter within last 6 months    Recent Outpatient Visits           5 months ago Allergic dermatitis   [facility] [place], Salvadore Oxford, NP   8 months ago Encounter for general adult medical examination with abnormal findings   [employer] [facility] [place], [place] W, NP   1 year ago Type 2 diabetes mellitus with hyperglycemia, without long-term current use of insulin [facility])   [facility] [place], [place] W, NP   1 year ago Type 2 diabetes mellitus with hyperglycemia, without long-term current use of insulin [facility])   [place] [facility] [place], Salvadore Oxford, NP   1 year ago Medicare annual wellness visit, subsequent   [facility] [place] [facility] [place], Salvadore Oxford, [place]

## 2023-05-17 ENCOUNTER — Other Ambulatory Visit: Payer: Self-pay | Admitting: Internal Medicine

## 2023-05-18 NOTE — Telephone Encounter (Signed)
Courtesy refill. Requested Prescriptions  Pending Prescriptions Disp Refills   metFORMIN (GLUCOPHAGE) 1000 MG tablet [Pharmacy Med Name: METFORMIN HYDROCHLORIDE 1000 MG Tablet] 60 tablet 0    Sig: TAKE 1 TABLET TWICE DAILY WITH MEALS     Endocrinology:  Diabetes - Biguanides Failed - 05/17/2023  5:16 PM      Failed - HBA1C is between 0 and 7.9 and within 180 days    Hgb A1c MFr Bld  Date Value Ref Range Status  09/01/2022 6.3 (H) <5.7 % of total Hgb Final    Comment:    For someone without known diabetes, a hemoglobin  A1c value between 5.7% and 6.4% is consistent with prediabetes and should be confirmed with a  follow-up test. . For someone with known diabetes, a value <7% indicates that their diabetes is well controlled. A1c targets should be individualized based on duration of diabetes, age, comorbid conditions, and other considerations. . This assay result is consistent with an increased risk of diabetes. . Currently, no consensus exists regarding use of hemoglobin A1c for diagnosis of diabetes for children. .          Failed - B12 Level in normal range and within 720 days    No results found for: "VITAMINB12"       Failed - Valid encounter within last 6 months    Recent Outpatient Visits           6 months ago Allergic dermatitis   [place] [facility] [place], Salvadore Oxford, NP   8 months ago Encounter for general adult medical examination with abnormal findings   [place] [facility] [place], Salvadore Oxford, NP   1 year ago Type 2 diabetes mellitus with hyperglycemia, without long-term current use of insulin [facility])   [place] [facility] [place], Salvadore Oxford, NP   1 year ago Type 2 diabetes mellitus with hyperglycemia, without long-term current use of insulin [facility])   [facility] [place], [place], NP   1 year ago Medicare annual wellness visit, subsequent   [place] [facility]  [place], [place], NP              Failed - CBC within normal limits and completed in the last 12 months    WBC  Date Value Ref Range Status  09/01/2022 11.1 (H) 3.8 - 10.8 Thousand/uL Final   RBC  Date Value Ref Range Status  09/01/2022 5.18 4.20 - 5.80 Million/uL Final   Hemoglobin  Date Value Ref Range Status  09/01/2022 15.5 13.2 - 17.1 g/dL Final   HCT  Date Value Ref Range Status  09/01/2022 45.7 38.5 - 50.0 % Final   MCHC  Date Value Ref Range Status  09/01/2022 33.9 32.0 - 36.0 g/dL Final   [facility]  Date Value Ref Range Status  09/01/2022 29.9 27.0 - 33.0 pg Final   MCV  Date Value Ref Range Status  09/01/2022 88.2 80.0 - 100.0 fL Final   No results found for: "PLTCOUNTKUC", "LABPLAT", "POCPLA" RDW  Date Value Ref Range Status  09/01/2022 13.6 11.0 - 15.0 % Final         Passed - Cr in normal range and within 360 days    Creat  Date Value Ref Range Status  09/01/2022 1.03 0.70 - 1.28 mg/dL Final   Creatinine, Urine  Date Value Ref Range Status  03/02/2022 76 20 - 320 mg/dL Final  Passed - eGFR in normal range and within 360 days    GFR  Date Value Ref Range Status  06/23/2020 61.60 >60.00 mL/min Final   eGFR  Date Value Ref Range Status  09/01/2022 77 > OR = 60 mL/min/1.15m2 Final

## 2023-05-27 ENCOUNTER — Ambulatory Visit (INDEPENDENT_AMBULATORY_CARE_PROVIDER_SITE_OTHER): Payer: Medicare HMO | Admitting: Internal Medicine

## 2023-05-27 ENCOUNTER — Encounter: Payer: Self-pay | Admitting: Internal Medicine

## 2023-05-27 VITALS — BP 135/66 | HR 79 | Temp 97.6°F | Resp 17 | Ht 69.0 in | Wt 186.2 lb

## 2023-05-27 DIAGNOSIS — I10 Essential (primary) hypertension: Secondary | ICD-10-CM

## 2023-05-27 DIAGNOSIS — E785 Hyperlipidemia, unspecified: Secondary | ICD-10-CM | POA: Diagnosis not present

## 2023-05-27 DIAGNOSIS — E1169 Type 2 diabetes mellitus with other specified complication: Secondary | ICD-10-CM

## 2023-05-27 DIAGNOSIS — E1165 Type 2 diabetes mellitus with hyperglycemia: Secondary | ICD-10-CM | POA: Diagnosis not present

## 2023-05-27 DIAGNOSIS — N522 Drug-induced erectile dysfunction: Secondary | ICD-10-CM | POA: Diagnosis not present

## 2023-05-27 DIAGNOSIS — E663 Overweight: Secondary | ICD-10-CM | POA: Diagnosis not present

## 2023-05-27 DIAGNOSIS — J410 Simple chronic bronchitis: Secondary | ICD-10-CM

## 2023-05-27 DIAGNOSIS — Z6827 Body mass index (BMI) 27.0-27.9, adult: Secondary | ICD-10-CM

## 2023-05-27 LAB — CBC
Hemoglobin: 14.7 g/dL (ref 13.2–17.1)
MCH: 30.2 pg (ref 27.0–33.0)
MCV: 89.9 fL (ref 80.0–100.0)
WBC: 8.2 10*3/uL (ref 3.8–10.8)

## 2023-05-27 LAB — POCT GLYCOSYLATED HEMOGLOBIN (HGB A1C): Hemoglobin A1C: 6.5 % — AB (ref 4.0–5.6)

## 2023-05-27 NOTE — Assessment & Plan Note (Signed)
Controlled on losartan Reinforced DASH diet and exercise for weight loss C-Met today 

## 2023-05-27 NOTE — Assessment & Plan Note (Signed)
Continue sildenafil as needed. 

## 2023-05-27 NOTE — Assessment & Plan Note (Signed)
POCT A1c 6.5% We will check urine microalbumin Continue metformin Encourage low-carb diet and exercise for weight loss Encouraged routine eye exam Encouraged routine foot exam Will give Prevnar 20 at next visit Encourage COVID booster

## 2023-05-27 NOTE — Patient Instructions (Signed)

## 2023-05-27 NOTE — Assessment & Plan Note (Signed)
C-Met lipid profile today Encouraged him to consume a low-fat diet Continue rosuvastatin, fish oil and ezetimibe

## 2023-05-27 NOTE — Assessment & Plan Note (Signed)
Asymptomatic off inhalers We will monitor

## 2023-05-27 NOTE — Assessment & Plan Note (Signed)
Encourage diet and exercise for weight loss 

## 2023-05-27 NOTE — Progress Notes (Signed)
Subjective:    Patient ID: Charles Smith, male    DOB: Apr 22, 1951, 72 y.o.   MRN: [id number]  HPI  Patient presents to clinic today for follow-up of chronic conditions.  HTN: His BP today is 135/66.  He is taking losartan as prescribed.  There is no ECG on file.  HLD: His last LDL was 19, triglycerides 409, 08/2022.  He denies myalgias on rosuvastatin, fish oil and ezetimibe.  He does not consume a low-fat diet.  ED: Managed with sildenafil as needed.  He does not follow with urology.  COPD: He denies chronic cough or shortness of breath.  He is not using any inhalers at this time.  There are no PFTs on file.  He does not smoke.  DM2: His last A1c was 6.3%, 08/2022.  He is taking metformin as prescribed.  His sugars range 122-136.  He checks his feet routinely.  His last eye exam was 11/2022.  Flu 07/2022.  Pneumovax 11/2016.  Prevnar 04/2015.  COVID x 2.  Review of Systems     Past Medical History:  Diagnosis Date   History of chicken pox     Current Outpatient Medications  Medication Sig Dispense Refill   Ascorbic Acid (VITAMIN C) 1000 MG tablet Take 1,000 mg by mouth daily.     ASPIRIN LOW DOSE 81 MG tablet TAKE 1 TABLET EVERY DAY SWALLOW WHOLE 90 tablet 0   blood glucose meter kit and supplies Check blood sugar twice daily 1 each 0   ezetimibe (ZETIA) 10 MG tablet TAKE 1 TABLET EVERY DAY (SCHEDULE A FOLLOW UP APPOINTMENT PRIOR TO FURTHER REFILLS) 90 tablet 1   losartan (COZAAR) 100 MG tablet TAKE 1 TABLET EVERY DAY 90 tablet 0   metFORMIN (GLUCOPHAGE) 1000 MG tablet TAKE 1 TABLET TWICE DAILY WITH MEALS 60 tablet 0   Multiple Vitamins-Minerals (CENTRUM ADULTS PO) Take 1 capsule by mouth daily.     Omega-3 Fatty Acids (FISH OIL) 1000 MG CAPS Take 1,000 mg by mouth daily.      predniSONE (DELTASONE) 10 MG tablet Take 6 tabs on day 1, 5 tabs on day 2, 4 tabs on day 3, 3 tabs on day 4, 2 tabs on day 5, 1 tab on day 6 21 tablet 0   rosuvastatin (CRESTOR) 10 MG tablet TAKE 1  TABLET EVERY DAY (NEED MD APPOINTMENT FOR REFILLS) 90 tablet 3   sildenafil (VIAGRA) 50 MG tablet TAKE 1/2 TABLET (25 MG TOTAL) BY MOUTH DAILY AS NEEDED FOR ERECTILE DYSFUNCTION. 90 tablet 0   No current facility-administered medications for this visit.    No Known Allergies  Family History  Problem Relation Age of Onset   Hyperlipidemia Sister    Heart disease Brother        heart transplant   Kidney cancer Brother    Depression Neg Hx    Stroke Neg Hx     Social History   Socioeconomic History   Marital status: Divorced    Spouse name: Not on file   Number of children: 3   Years of education: Not on file   Highest education level: 12th grade  Occupational History   Occupation: Retired  Tobacco Use   Smoking status: Former    Types: Cigarettes   Smokeless tobacco: Never  Vaping Use   Vaping status: Never Used  Substance and Sexual Activity   Alcohol use: Yes    Alcohol/week: 0.0 standard drinks of alcohol    Comment: occasional   Drug  use: No   Sexual activity: Yes  Other Topics Concern   Not on file  Social History Narrative   Not on file   Social Determinants of Health   Financial Resource Strain: Low Risk  (05/23/2023)   Overall Financial Resource Strain (CARDIA)    Difficulty of Paying Living Expenses: Not very hard  Food Insecurity: No Food Insecurity (05/23/2023)   Hunger Vital Sign    Worried About Running Out of Food in the Last Year: Never true    Ran Out of Food in the Last Year: Never true  Transportation Needs: No Transportation Needs (05/23/2023)   PRAPARE - Administrator, Civil Service (Medical): No    Lack of Transportation (Non-Medical): No  Physical Activity: Insufficiently Active (05/23/2023)   Exercise Vital Sign    Days of Exercise per Week: 3 days    Minutes of Exercise per Session: 10 min  Stress: No Stress Concern Present (05/23/2023)   [employer] of Occupational Health - Occupational Stress Questionnaire    Feeling of  Stress : Not at all  Social Connections: Moderately Isolated (05/23/2023)   Social Connection and Isolation Panel [NHANES]    Frequency of Communication with Friends and Family: Twice a week    Frequency of Social Gatherings with Friends and Family: More than three times a week    Attends Religious Services: More than 4 times per year    Active Member of [employer] or Organizations: No    Attends Banker Meetings: Never    Marital Status: Divorced  Catering manager Violence: Not At Risk (08/10/2022)   Humiliation, Afraid, Rape, and Kick questionnaire    Fear of Current or Ex-Partner: No    Emotionally Abused: No    Physically Abused: No    Sexually Abused: No     Constitutional: Denies fever, malaise, fatigue, headache or abrupt weight changes.  HEENT: Denies eye pain, eye redness, ear pain, ringing in the ears, wax buildup, runny nose, nasal congestion, bloody nose, or sore throat. Respiratory: Denies difficulty breathing, shortness of breath, cough or sputum production.   Cardiovascular: Denies chest pain, chest tightness, palpitations or swelling in the hands or feet.  Gastrointestinal: Denies abdominal pain, bloating, constipation, diarrhea or blood in the stool.  GU: Patient reports erectile dysfunction.  Denies urgency, frequency, pain with urination, burning sensation, blood in urine, odor or discharge. Musculoskeletal: Denies decrease in range of motion, difficulty with gait, muscle pain or joint pain and swelling.  Skin: Denies redness, rashes, lesions or ulcercations.  Neurological: Denies dizziness, difficulty with memory, difficulty with speech or problems with balance and coordination.  Psych: Denies anxiety, depression, SI/HI.  No other specific complaints in a complete review of systems (except as listed in HPI above).  Objective:   Physical Exam  BP 135/66 (BP Location: Right Arm, Patient Position: Sitting, Cuff Size: Normal)   Pulse 79   Temp 97.6 F (36.4  C) (Oral)   Resp 17   Ht 5\' 9"  (1.753 m)   Wt 186 lb 3.2 oz (84.5 kg)   SpO2 98%   BMI 27.50 kg/m   Wt Readings from Last 3 Encounters:  11/16/22 183 lb (83 kg)  09/01/22 184 lb (83.5 kg)  08/10/22 184 lb 9.6 oz (83.7 kg)    General: Appears his stated age, overweight, in NAD. Skin: Warm, dry and intact. No ulcerations noted. HEENT: Head: normal shape and size; Eyes: sclera white, no icterus, conjunctiva pink, PERRLA and EOMs intact;  Cardiovascular:  Normal rate and rhythm. S1,S2 noted.  No murmur, rubs or gallops noted. No JVD or BLE edema. No carotid bruits noted. Pulmonary/Chest: Normal effort and positive vesicular breath sounds. No respiratory distress. No wheezes, rales or ronchi noted.  Musculoskeletal:  No difficulty with gait.  Neurological: Alert and oriented. Coordination normal.  Psychiatric: Mood and affect normal. Behavior is normal. Judgment and thought content normal.    BMET    Component Value Date/Time   NA 139 09/01/2022 0953   K 4.5 09/01/2022 0953   CL 104 09/01/2022 0953   CO2 25 09/01/2022 0953   GLUCOSE 111 (H) 09/01/2022 0953   BUN 23 09/01/2022 0953   CREATININE 1.03 09/01/2022 0953   CALCIUM 10.1 09/01/2022 0953    Lipid Panel     Component Value Date/Time   CHOL 91 09/01/2022 0953   TRIG 269 (H) 09/01/2022 0953   HDL 40 09/01/2022 0953   CHOLHDL 2.3 09/01/2022 0953   VLDL 69.6 (H) 09/14/2019 1353   LDLCALC 19 09/01/2022 0953    CBC    Component Value Date/Time   WBC 11.1 (H) 09/01/2022 0953   RBC 5.18 09/01/2022 0953   HGB 15.5 09/01/2022 0953   HCT 45.7 09/01/2022 0953   PLT 268 09/01/2022 0953   MCV 88.2 09/01/2022 0953   MCH 29.9 09/01/2022 0953   MCHC 33.9 09/01/2022 0953   RDW 13.6 09/01/2022 0953    Hgb A1C Lab Results  Component Value Date   HGBA1C 6.3 (H) 09/01/2022           Assessment & Plan:     RTC in 6 months for your annual exam Nicki Reaper, NP

## 2023-05-28 ENCOUNTER — Encounter: Payer: Self-pay | Admitting: Internal Medicine

## 2023-05-28 LAB — COMPLETE METABOLIC PANEL WITH GFR
AG Ratio: 1.8 (calc) (ref 1.0–2.5)
ALT: 42 U/L (ref 9–46)
AST: 31 U/L (ref 10–35)
Albumin: 4.4 g/dL (ref 3.6–5.1)
Alkaline phosphatase (APISO): 56 U/L (ref 35–144)
BUN: 22 mg/dL (ref 7–25)
CO2: 25 mmol/L (ref 20–32)
Calcium: 9.5 mg/dL (ref 8.6–10.3)
Chloride: 105 mmol/L (ref 98–110)
Creat: 1.04 mg/dL (ref 0.70–1.28)
Globulin: 2.5 g/dL (calc) (ref 1.9–3.7)
Glucose, Bld: 129 mg/dL — ABNORMAL HIGH (ref 65–99)
Potassium: 4.4 mmol/L (ref 3.5–5.3)
Sodium: 139 mmol/L (ref 135–146)
Total Bilirubin: 1.4 mg/dL — ABNORMAL HIGH (ref 0.2–1.2)
Total Protein: 6.9 g/dL (ref 6.1–8.1)
eGFR: 76 mL/min/{1.73_m2} (ref >=60)

## 2023-05-28 LAB — CBC
HCT: 43.7 % (ref 38.5–50.0)
MCHC: 33.6 g/dL (ref 32.0–36.0)
MPV: 10.6 fL (ref 7.5–12.5)
Platelets: 266 10*3/uL (ref 140–400)
RBC: 4.86 10*6/uL (ref 4.20–5.80)
RDW: 14 % (ref 11.0–15.0)

## 2023-05-28 LAB — MICROALBUMIN / CREATININE URINE RATIO
Creatinine, Urine: 99 mg/dL (ref 20–320)
Microalb Creat Ratio: 6 mg/g creat (ref <30)
Microalb, Ur: 0.6 mg/dL

## 2023-05-28 LAB — LIPID PANEL
Cholesterol: 88 mg/dL (ref <200)
HDL: 32 mg/dL — ABNORMAL LOW (ref >=40)
LDL Cholesterol (Calc): 23 mg/dL (calc)
Non-HDL Cholesterol (Calc): 56 mg/dL (calc) (ref <130)
Total CHOL/HDL Ratio: 2.8 (calc) (ref <5.0)
Triglycerides: 310 mg/dL — ABNORMAL HIGH (ref <150)

## 2023-05-30 MED ORDER — ROSUVASTATIN CALCIUM 20 MG PO TABS: 20.0000 mg | ORAL_TABLET | Freq: Every day | ORAL | 1 refills | Status: DC

## 2023-05-31 MED ORDER — ROSUVASTATIN CALCIUM 20 MG PO TABS: 20.0000 mg | ORAL_TABLET | Freq: Every day | ORAL | 1 refills | Status: DC

## 2023-05-31 NOTE — Addendum Note (Signed)
Addended by: Judd Gaudier on: 05/31/2023 11:45 AM   Modules accepted: Orders

## 2023-06-16 ENCOUNTER — Other Ambulatory Visit: Payer: Self-pay | Admitting: Internal Medicine

## 2023-06-17 NOTE — Telephone Encounter (Signed)
Labs and office visits are in date.  Requested Prescriptions  Pending Prescriptions Disp Refills   metFORMIN (GLUCOPHAGE) 1000 MG tablet [Pharmacy Med Name: METFORMIN HYDROCHLORIDE 1000 MG Tablet] 180 tablet 0    Sig: TAKE 1 TABLET TWICE DAILY WITH MEALS     Endocrinology:  Diabetes - Biguanides Failed - 06/16/2023 10:49 AM      Failed - B12 Level in normal range and within 720 days    No results found for: "VITAMINB12"       Failed - CBC within normal limits and completed in the last 12 months    WBC  Date Value Ref Range Status  05/27/2023 8.2 3.8 - 10.8 Thousand/uL Final   RBC  Date Value Ref Range Status  05/27/2023 4.86 4.20 - 5.80 Million/uL Final   Hemoglobin  Date Value Ref Range Status  05/27/2023 14.7 13.2 - 17.1 g/dL Final   HCT  Date Value Ref Range Status  05/27/2023 43.7 38.5 - 50.0 % Final   MCHC  Date Value Ref Range Status  05/27/2023 33.6 32.0 - 36.0 g/dL Final   [facility]  Date Value Ref Range Status  05/27/2023 30.2 27.0 - 33.0 pg Final   MCV  Date Value Ref Range Status  05/27/2023 89.9 80.0 - 100.0 fL Final   No results found for: "PLTCOUNTKUC", "LABPLAT", "POCPLA" RDW  Date Value Ref Range Status  05/27/2023 14.0 11.0 - 15.0 % Final         Passed - Cr in normal range and within 360 days    Creat  Date Value Ref Range Status  05/27/2023 1.04 0.70 - 1.28 mg/dL Final   Creatinine, Urine  Date Value Ref Range Status  05/27/2023 99 20 - 320 mg/dL Final         Passed - HBA1C is between 0 and 7.9 and within 180 days    Hemoglobin A1C  Date Value Ref Range Status  05/27/2023 6.5 (A) 4.0 - 5.6 % Final   Hgb A1c MFr Bld  Date Value Ref Range Status  09/01/2022 6.3 (H) <5.7 % of total Hgb Final    Comment:    For someone without known diabetes, a hemoglobin  A1c value between 5.7% and 6.4% is consistent with prediabetes and should be confirmed with a  follow-up test. . For someone with known diabetes, a value <7% indicates that their  diabetes is well controlled. A1c targets should be individualized based on duration of diabetes, age, comorbid conditions, and other considerations. . This assay result is consistent with an increased risk of diabetes. . Currently, no consensus exists regarding use of hemoglobin A1c for diagnosis of diabetes for children. .          Passed - eGFR in normal range and within 360 days    GFR  Date Value Ref Range Status  06/23/2020 61.60 >60.00 mL/min Final   eGFR  Date Value Ref Range Status  05/27/2023 76 > OR = 60 mL/min/1.37m2 Final         Passed - Valid encounter within last 6 months    Recent Outpatient Visits           3 weeks ago Type 2 diabetes mellitus with hyperglycemia, without long-term current use of insulin [facility])   [place] [facility] [place], Salvadore Oxford, NP   7 months ago Allergic dermatitis   [place] [facility] [place], Salvadore Oxford, NP   9 months ago Encounter for general adult medical examination  with abnormal findings   [place] [facility] [place], [place] W, NP   1 year ago Type 2 diabetes mellitus with hyperglycemia, without long-term current use of insulin [facility])   [place] [facility] [place], [place] W, NP   1 year ago Type 2 diabetes mellitus with hyperglycemia, without long-term current use of insulin [facility])   [place] [facility] [place], Salvadore Oxford, NP       Future Appointments             In 2 months Baity, Salvadore Oxford, NP [place] [facility], [facility]

## 2023-07-05 DIAGNOSIS — H2513 Age-related nuclear cataract, bilateral: Secondary | ICD-10-CM | POA: Diagnosis not present

## 2023-07-05 DIAGNOSIS — H33042 Retinal detachment with retinal dialysis, left eye: Secondary | ICD-10-CM | POA: Diagnosis not present

## 2023-07-05 DIAGNOSIS — H4312 Vitreous hemorrhage, left eye: Secondary | ICD-10-CM | POA: Diagnosis not present

## 2023-07-05 DIAGNOSIS — H33012 Retinal detachment with single break, left eye: Secondary | ICD-10-CM | POA: Diagnosis not present

## 2023-07-05 DIAGNOSIS — H43393 Other vitreous opacities, bilateral: Secondary | ICD-10-CM | POA: Diagnosis not present

## 2023-07-05 DIAGNOSIS — E113311 Type 2 diabetes mellitus with moderate nonproliferative diabetic retinopathy with macular edema, right eye: Secondary | ICD-10-CM | POA: Diagnosis not present

## 2023-07-05 DIAGNOSIS — H35431 Paving stone degeneration of retina, right eye: Secondary | ICD-10-CM | POA: Diagnosis not present

## 2023-07-05 DIAGNOSIS — H43813 Vitreous degeneration, bilateral: Secondary | ICD-10-CM | POA: Diagnosis not present

## 2023-07-07 DIAGNOSIS — H33012 Retinal detachment with single break, left eye: Secondary | ICD-10-CM | POA: Diagnosis not present

## 2023-07-07 DIAGNOSIS — H33022 Retinal detachment with multiple breaks, left eye: Secondary | ICD-10-CM | POA: Diagnosis not present

## 2023-07-07 DIAGNOSIS — H35412 Lattice degeneration of retina, left eye: Secondary | ICD-10-CM | POA: Diagnosis not present

## 2023-07-17 ENCOUNTER — Other Ambulatory Visit: Payer: Self-pay | Admitting: Internal Medicine

## 2023-07-19 DIAGNOSIS — Z9889 Other specified postprocedural states: Secondary | ICD-10-CM | POA: Diagnosis not present

## 2023-07-19 DIAGNOSIS — H33012 Retinal detachment with single break, left eye: Secondary | ICD-10-CM | POA: Diagnosis not present

## 2023-07-19 NOTE — Telephone Encounter (Signed)
Rx 05/05/23 #90 Requested Prescriptions  Pending Prescriptions Disp Refills   losartan (COZAAR) 100 MG tablet [Pharmacy Med Name: LOSARTAN POTASSIUM 100 MG Tablet] 90 tablet 3    Sig: TAKE 1 TABLET EVERY DAY     Cardiovascular:  Angiotensin Receptor Blockers Passed - 07/17/2023  2:36 AM      Passed - Cr in normal range and within 180 days    Creat  Date Value Ref Range Status  05/27/2023 1.04 0.70 - 1.28 mg/dL Final   Creatinine, Urine  Date Value Ref Range Status  05/27/2023 99 20 - 320 mg/dL Final         Passed - K in normal range and within 180 days    Potassium  Date Value Ref Range Status  05/27/2023 4.4 3.5 - 5.3 mmol/L Final         Passed - Patient is not pregnant      Passed - Last BP in normal range    BP Readings from Last 1 Encounters:  05/27/23 135/66         Passed - Valid encounter within last 6 months    Recent Outpatient Visits           1 month ago Type 2 diabetes mellitus with hyperglycemia, without long-term current use of insulin [facility])   [place] [facility] [place], Salvadore Oxford, NP   8 months ago Allergic dermatitis   [place] [facility] [place], Salvadore Oxford, NP   10 months ago Encounter for general adult medical examination with abnormal findings   [place] [facility] [place], [place] W, NP   1 year ago Type 2 diabetes mellitus with hyperglycemia, without long-term current use of insulin [facility])   [place] [place], [place] W, NP   2 years ago Type 2 diabetes mellitus with hyperglycemia, without long-term current use of insulin [facility])   [place] [facility] [place], Salvadore Oxford, NP       Future Appointments             In 1 month Baity, Salvadore Oxford, NP [place] [facility], [facility]

## 2023-07-23 ENCOUNTER — Other Ambulatory Visit: Payer: Self-pay | Admitting: Internal Medicine

## 2023-07-25 NOTE — Telephone Encounter (Signed)
Requested Prescriptions  Refused Prescriptions Disp Refills   rosuvastatin (CRESTOR) 10 MG tablet [Pharmacy Med Name: Rosuvastatin Calcium Oral Tablet 10 MG] 90 tablet 3    Sig: TAKE 1 TABLET EVERY DAY (NEED MD APPOINTMENT FOR REFILLS)     Cardiovascular:  Antilipid - Statins 2 Failed - 07/23/2023  3:31 AM      Failed - Lipid Panel in normal range within the last 12 months    Cholesterol  Date Value Ref Range Status  05/27/2023 88 <200 mg/dL Final   LDL Cholesterol (Calc)  Date Value Ref Range Status  05/27/2023 23 mg/dL (calc) Final    Comment:    Reference range: <100 . Desirable range <100 mg/dL for primary prevention;   <70 mg/dL for patients with CHD or diabetic patients  with > or = 2 CHD risk factors. Marland Kitchen LDL-C is now calculated using the Martin-Hopkins  calculation, which is a validated novel method providing  better accuracy than the Friedewald equation in the  estimation of LDL-C.  Horald Pollen et al. Lenox Ahr. 1610;960(45): 2061-2068  (http://education.[facility].com/faq/FAQ164)    Direct LDL  Date Value Ref Range Status  06/23/2020 40.0 mg/dL Final    Comment:    Optimal:  <100 mg/dLNear or Above Optimal:  100-129 mg/dLBorderline High:  130-159 mg/dLHigh:  160-189 mg/dLVery High:  >190 mg/dL   HDL  Date Value Ref Range Status  05/27/2023 32 (L) > OR = 40 mg/dL Final   Triglycerides  Date Value Ref Range Status  05/27/2023 310 (H) <150 mg/dL Final    Comment:    . If a non-fasting specimen was collected, consider repeat triglyceride testing on a fasting specimen if clinically indicated.  Perry Mount et al. J. of Clin. Lipidol. 2015;9:129-169. Marland Kitchen          Passed - Cr in normal range and within 360 days    Creat  Date Value Ref Range Status  05/27/2023 1.04 0.70 - 1.28 mg/dL Final   Creatinine, Urine  Date Value Ref Range Status  05/27/2023 99 20 - 320 mg/dL Final         Passed - Patient is not pregnant      Passed - Valid encounter within last 12  months    Recent Outpatient Visits           1 month ago Type 2 diabetes mellitus with hyperglycemia, without long-term current use of insulin [facility])   [place] [facility] [place], Salvadore Oxford, NP   8 months ago Allergic dermatitis   [place] [facility] [place], Salvadore Oxford, NP   10 months ago Encounter for general adult medical examination with abnormal findings   [facility] [place], [place] W, NP   1 year ago Type 2 diabetes mellitus with hyperglycemia, without long-term current use of insulin [facility])   [facility] [place], [place] W, NP   2 years ago Type 2 diabetes mellitus with hyperglycemia, without long-term current use of insulin [facility])   [place] [facility] [place], Salvadore Oxford, NP       Future Appointments             In 1 month Baity, Salvadore Oxford, NP [place] [facility], [facility]

## 2023-08-01 ENCOUNTER — Other Ambulatory Visit: Payer: Self-pay | Admitting: Internal Medicine

## 2023-08-02 NOTE — Telephone Encounter (Signed)
There is a note to schedule a f/u visit however pt was seen 2 months ago and all her labs are in date.  It's an old note.  Requested Prescriptions  Pending Prescriptions Disp Refills   ezetimibe (ZETIA) 10 MG tablet [Pharmacy Med Name: Ezetimibe Oral Tablet 10 MG] 90 tablet 3    Sig: TAKE 1 TABLET EVERY DAY (SCHEDULE A FOLLOW UP APPOINTMENT PRIOR TO FURTHER REFILLS)     Cardiovascular:  Antilipid - Sterol Transport Inhibitors Failed - 08/01/2023  2:24 AM      Failed - Lipid Panel in normal range within the last 12 months    Cholesterol  Date Value Ref Range Status  05/27/2023 88 <200 mg/dL Final   LDL Cholesterol (Calc)  Date Value Ref Range Status  05/27/2023 23 mg/dL (calc) Final    Comment:    Reference range: <100 . Desirable range <100 mg/dL for primary prevention;   <70 mg/dL for patients with CHD or diabetic patients  with > or = 2 CHD risk factors. Marland Kitchen LDL-C is now calculated using the Martin-Hopkins  calculation, which is a validated novel method providing  better accuracy than the Friedewald equation in the  estimation of LDL-C.  Horald Pollen et al. Lenox Ahr. 1610;960(45): 2061-2068  (http://education.[facility].com/faq/FAQ164)    Direct LDL  Date Value Ref Range Status  06/23/2020 40.0 mg/dL Final    Comment:    Optimal:  <100 mg/dLNear or Above Optimal:  100-129 mg/dLBorderline High:  130-159 mg/dLHigh:  160-189 mg/dLVery High:  >190 mg/dL   HDL  Date Value Ref Range Status  05/27/2023 32 (L) > OR = 40 mg/dL Final   Triglycerides  Date Value Ref Range Status  05/27/2023 310 (H) <150 mg/dL Final    Comment:    . If a non-fasting specimen was collected, consider repeat triglyceride testing on a fasting specimen if clinically indicated.  Perry Mount et al. J. of Clin. Lipidol. 2015;9:129-169. Marland Kitchen          Passed - AST in normal range and within 360 days    AST  Date Value Ref Range Status  05/27/2023 31 10 - 35 U/L Final         Passed - ALT in normal  range and within 360 days    ALT  Date Value Ref Range Status  05/27/2023 42 9 - 46 U/L Final         Passed - Patient is not pregnant      Passed - Valid encounter within last 12 months    Recent Outpatient Visits           2 months ago Type 2 diabetes mellitus with hyperglycemia, without long-term current use of insulin [facility])   [place] [facility] [place], Salvadore Oxford, NP   8 months ago Allergic dermatitis   [place] [facility] [place], Salvadore Oxford, NP   11 months ago Encounter for general adult medical examination with abnormal findings   [place] [facility] [place], [place], NP   1 year ago Type 2 diabetes mellitus with hyperglycemia, without long-term current use of insulin [facility])   [place] [facility] [place], [place], NP   2 years ago Type 2 diabetes mellitus with hyperglycemia, without long-term current use of insulin [facility])   [place] [facility] [place], Salvadore Oxford, NP       Future Appointments  In 1 month Baity, Salvadore Oxford, NP [place] [facility], [facility]

## 2023-08-09 DIAGNOSIS — Z9889 Other specified postprocedural states: Secondary | ICD-10-CM | POA: Diagnosis not present

## 2023-08-09 DIAGNOSIS — H43811 Vitreous degeneration, right eye: Secondary | ICD-10-CM | POA: Diagnosis not present

## 2023-08-29 ENCOUNTER — Other Ambulatory Visit: Payer: Self-pay | Admitting: Internal Medicine

## 2023-08-29 NOTE — Telephone Encounter (Signed)
Requested Prescriptions  Pending Prescriptions Disp Refills   metFORMIN (GLUCOPHAGE) 1000 MG tablet [Pharmacy Med Name: metFORMIN HCl Oral Tablet 1000 MG] 180 tablet 0    Sig: TAKE 1 TABLET TWICE DAILY WITH MEALS     Endocrinology:  Diabetes - Biguanides Failed - 08/29/2023  3:07 AM      Failed - B12 Level in normal range and within 720 days    No results found for: "VITAMINB12"       Failed - CBC within normal limits and completed in the last 12 months    WBC  Date Value Ref Range Status  05/27/2023 8.2 3.8 - 10.8 Thousand/uL Final   RBC  Date Value Ref Range Status  05/27/2023 4.86 4.20 - 5.80 Million/uL Final   Hemoglobin  Date Value Ref Range Status  05/27/2023 14.7 13.2 - 17.1 g/dL Final   HCT  Date Value Ref Range Status  05/27/2023 43.7 38.5 - 50.0 % Final   MCHC  Date Value Ref Range Status  05/27/2023 33.6 32.0 - 36.0 g/dL Final   [facility]  Date Value Ref Range Status  05/27/2023 30.2 27.0 - 33.0 pg Final   MCV  Date Value Ref Range Status  05/27/2023 89.9 80.0 - 100.0 fL Final   No results found for: "PLTCOUNTKUC", "LABPLAT", "POCPLA" RDW  Date Value Ref Range Status  05/27/2023 14.0 11.0 - 15.0 % Final         Passed - Cr in normal range and within 360 days    Creat  Date Value Ref Range Status  05/27/2023 1.04 0.70 - 1.28 mg/dL Final   Creatinine, Urine  Date Value Ref Range Status  05/27/2023 99 20 - 320 mg/dL Final         Passed - HBA1C is between 0 and 7.9 and within 180 days    Hemoglobin A1C  Date Value Ref Range Status  05/27/2023 6.5 (A) 4.0 - 5.6 % Final   Hgb A1c MFr Bld  Date Value Ref Range Status  09/01/2022 6.3 (H) <5.7 % of total Hgb Final    Comment:    For someone without known diabetes, a hemoglobin  A1c value between 5.7% and 6.4% is consistent with prediabetes and should be confirmed with a  follow-up test. . For someone with known diabetes, a value <7% indicates that their diabetes is well controlled. A1c targets  should be individualized based on duration of diabetes, age, comorbid conditions, and other considerations. . This assay result is consistent with an increased risk of diabetes. . Currently, no consensus exists regarding use of hemoglobin A1c for diagnosis of diabetes for children. .          Passed - eGFR in normal range and within 360 days    GFR  Date Value Ref Range Status  06/23/2020 61.60 >60.00 mL/min Final   eGFR  Date Value Ref Range Status  05/27/2023 76 > OR = 60 mL/min/1.46m2 Final         Passed - Valid encounter within last 6 months    Recent Outpatient Visits           3 months ago Type 2 diabetes mellitus with hyperglycemia, without long-term current use of insulin [facility])   [place] [facility], Salvadore Oxford, NP   9 months ago Allergic dermatitis   [facility] [place], Salvadore Oxford, NP   12 months ago Encounter for general adult medical examination with abnormal findings   [facility] [place], [place] W, NP   1 year ago Type 2 diabetes mellitus with hyperglycemia, without long-term current use of insulin [facility])   [place] [facility] [place], [place] W, NP   2 years ago Type 2 diabetes mellitus with hyperglycemia, without long-term current use of insulin [facility])   [place] [facility] [place], Salvadore Oxford, NP       Future Appointments             In 1 week [place], Salvadore Oxford, NP  [facility] [place] [facility], [facility]

## 2023-09-03 ENCOUNTER — Other Ambulatory Visit: Payer: Self-pay | Admitting: Internal Medicine

## 2023-09-05 ENCOUNTER — Encounter: Payer: Self-pay | Admitting: Internal Medicine

## 2023-09-05 ENCOUNTER — Ambulatory Visit (INDEPENDENT_AMBULATORY_CARE_PROVIDER_SITE_OTHER): Payer: Medicare HMO | Admitting: Internal Medicine

## 2023-09-05 VITALS — BP 112/70 | HR 89 | Ht 69.0 in | Wt 190.0 lb

## 2023-09-05 DIAGNOSIS — E1165 Type 2 diabetes mellitus with hyperglycemia: Secondary | ICD-10-CM

## 2023-09-05 DIAGNOSIS — E663 Overweight: Secondary | ICD-10-CM

## 2023-09-05 DIAGNOSIS — Z6828 Body mass index (BMI) 28.0-28.9, adult: Secondary | ICD-10-CM | POA: Diagnosis not present

## 2023-09-05 DIAGNOSIS — Z125 Encounter for screening for malignant neoplasm of prostate: Secondary | ICD-10-CM | POA: Diagnosis not present

## 2023-09-05 DIAGNOSIS — Z1211 Encounter for screening for malignant neoplasm of colon: Secondary | ICD-10-CM | POA: Diagnosis not present

## 2023-09-05 DIAGNOSIS — Z0001 Encounter for general adult medical examination with abnormal findings: Secondary | ICD-10-CM

## 2023-09-05 NOTE — Telephone Encounter (Signed)
Requested Prescriptions  Pending Prescriptions Disp Refills   losartan (COZAAR) 100 MG tablet [Pharmacy Med Name: Losartan Potassium Oral Tablet 100 MG] 90 tablet 1    Sig: TAKE 1 TABLET EVERY DAY     Cardiovascular:  Angiotensin Receptor Blockers Passed - 09/03/2023  5:01 AM      Passed - Cr in normal range and within 180 days    Creat  Date Value Ref Range Status  05/27/2023 1.04 0.70 - 1.28 mg/dL Final   Creatinine, Urine  Date Value Ref Range Status  05/27/2023 99 20 - 320 mg/dL Final         Passed - K in normal range and within 180 days    Potassium  Date Value Ref Range Status  05/27/2023 4.4 3.5 - 5.3 mmol/L Final         Passed - Patient is not pregnant      Passed - Last BP in normal range    BP Readings from Last 1 Encounters:  05/27/23 135/66         Passed - Valid encounter within last 6 months    Recent Outpatient Visits           Today Encounter for general adult medical examination with abnormal findings   [place] [facility] [place], [place] W, NP   3 months ago Type 2 diabetes mellitus with hyperglycemia, without long-term current use of insulin [facility]   [place] [facility] [place], Salvadore Oxford, NP   9 months ago Allergic dermatitis   [place] [facility] [place], Salvadore Oxford, NP   1 year ago Encounter for general adult medical examination with abnormal findings   [place] [facility] [place], [place] W, NP   1 year ago Type 2 diabetes mellitus with hyperglycemia, without long-term current use of insulin [facility])   [place] [facility] [place], Salvadore Oxford, NP       Future Appointments             In 6 months Baity, Salvadore Oxford, NP [place] [facility], [facility]

## 2023-09-05 NOTE — Patient Instructions (Signed)
Health Maintenance After Age 73 After age 73, you are at a higher risk for certain long-term diseases and infections as well as injuries from falls. Falls are a major cause of broken bones and head injuries in people who are older than age 73. Getting regular preventive care can help to keep you healthy and well. Preventive care includes getting regular testing and making lifestyle changes as recommended by your health care provider. Talk with your health care provider about: Which screenings and tests you should have. A screening is a test that checks for a disease when you have no symptoms. A diet and exercise plan that is right for you. What should I know about screenings and tests to prevent falls? Screening and testing are the best ways to find a health problem early. Early diagnosis and treatment give you the best chance of managing medical conditions that are common after age 73. Certain conditions and lifestyle choices may make you more likely to have a fall. Your health care provider may recommend: Regular vision checks. Poor vision and conditions such as cataracts can make you more likely to have a fall. If you wear glasses, make sure to get your prescription updated if your vision changes. Medicine review. Work with your health care provider to regularly review all of the medicines you are taking, including over-the-counter medicines. Ask your health care provider about any side effects that may make you more likely to have a fall. Tell your health care provider if any medicines that you take make you feel dizzy or sleepy. Strength and balance checks. Your health care provider may recommend certain tests to check your strength and balance while standing, walking, or changing positions. Foot health exam. Foot pain and numbness, as well as not wearing proper footwear, can make you more likely to have a fall. Screenings, including: Osteoporosis screening. Osteoporosis is a condition that causes  the bones to get weaker and break more easily. Blood pressure screening. Blood pressure changes and medicines to control blood pressure can make you feel dizzy. Depression screening. You may be more likely to have a fall if you have a fear of falling, feel depressed, or feel unable to do activities that you used to do. Alcohol use screening. Using too much alcohol can affect your balance and may make you more likely to have a fall. Follow these instructions at home: Lifestyle Do not drink alcohol if: Your health care provider tells you not to drink. If you drink alcohol: Limit how much you have to: 0-1 drink a day for women. 0-2 drinks a day for men. Know how much alcohol is in your drink. In the [place] one drink equals one 12 oz bottle of beer (355 mL), one 5 oz glass of wine (148 mL), or one 1 oz glass of hard liquor (44 mL). Do not use any products that contain nicotine or tobacco. These products include cigarettes, chewing tobacco, and vaping devices, such as e-cigarettes. If you need help quitting, ask your health care provider. Activity  Follow a regular exercise program to stay fit. This will help you maintain your balance. Ask your health care provider what types of exercise are appropriate for you. If you need a cane or walker, use it as recommended by your health care provider. Wear supportive shoes that have nonskid soles. Safety  Remove any tripping hazards, such as rugs, cords, and clutter. Install safety equipment such as grab bars in bathrooms and safety rails on stairs. Keep rooms and walkways   well-lit. General instructions Talk with your health care provider about your risks for falling. Tell your health care provider if: You fall. Be sure to tell your health care provider about all falls, even ones that seem minor. You feel dizzy, tiredness (fatigue), or off-balance. Take over-the-counter and prescription medicines only as told by your health care provider. These include  supplements. Eat a healthy diet and maintain a healthy weight. A healthy diet includes low-fat dairy products, low-fat (lean) meats, and fiber from whole grains, beans, and lots of fruits and vegetables. Stay current with your vaccines. Schedule regular health, dental, and eye exams. Summary Having a healthy lifestyle and getting preventive care can help to protect your health and wellness after age 73. Screening and testing are the best way to find a health problem early and help you avoid having a fall. Early diagnosis and treatment give you the best chance for managing medical conditions that are more common for people who are older than age 73. Falls are a major cause of broken bones and head injuries in people who are older than age 73. Take precautions to prevent a fall at home. Work with your health care provider to learn what changes you can make to improve your health and wellness and to prevent falls. This information is not intended to replace advice given to you by your health care provider. Make sure you discuss any questions you have with your health care provider. Document Revised: 03/23/2021 Document Reviewed: 03/23/2021 Elsevier Patient Education  2024 Elsevier Inc.  

## 2023-09-05 NOTE — Assessment & Plan Note (Signed)
Encouraged diet and exercise for weight loss ?

## 2023-09-05 NOTE — Progress Notes (Signed)
Subjective:    Patient ID: Charles Smith, male    DOB: [birth year], 72 y.o.   MRN: [id number]  HPI  Patient presents to clinic today for his annual exam.  Flu: 07/2022 Tetanus: 04/2015 COVID: X 3 Pneumovax: 11/2016 Prevnar 13: 04/2015 Shingrix: Never PSA screening: 08/2022 Colon screening: 06/2020 Vision screening: annually Dentist: biannually  Diet: He does eat meat. He consumes fruits and veggies. He does eat some fried foods. He drinks mostly water, unsweet tea. Exercise: Walking   Review of Systems     Past Medical History:  Diagnosis Date   History of chicken pox     Current Outpatient Medications  Medication Sig Dispense Refill   Ascorbic Acid (VITAMIN C) 1000 MG tablet Take 1,000 mg by mouth daily.     ASPIRIN LOW DOSE 81 MG tablet TAKE 1 TABLET EVERY DAY SWALLOW WHOLE 90 tablet 0   blood glucose meter kit and supplies Check blood sugar twice daily 1 each 0   ezetimibe (ZETIA) 10 MG tablet TAKE 1 TABLET EVERY DAY (SCHEDULE A FOLLOW UP APPOINTMENT PRIOR TO FURTHER REFILLS) 90 tablet 3   losartan (COZAAR) 100 MG tablet TAKE 1 TABLET EVERY DAY 90 tablet 0   metFORMIN (GLUCOPHAGE) 1000 MG tablet TAKE 1 TABLET TWICE DAILY WITH MEALS 180 tablet 0   Multiple Vitamins-Minerals (CENTRUM ADULTS PO) Take 1 capsule by mouth daily.     Omega-3 Fatty Acids (FISH OIL) 1000 MG CAPS Take 1,000 mg by mouth daily.      rosuvastatin (CRESTOR) 20 MG tablet Take 1 tablet (20 mg total) by mouth daily. 90 tablet 1   sildenafil (VIAGRA) 50 MG tablet TAKE 1/2 TABLET (25 MG TOTAL) BY MOUTH DAILY AS NEEDED FOR ERECTILE DYSFUNCTION. 90 tablet 0   No current facility-administered medications for this visit.    No Known Allergies  Family History  Problem Relation Age of Onset   Hyperlipidemia Sister    Heart disease Brother        heart transplant   Kidney cancer Brother    Depression Neg Hx    Stroke Neg Hx     Social History   Socioeconomic History   Marital status: Divorced     Spouse name: Not on file   Number of children: 3   Years of education: Not on file   Highest education level: Some college, no degree  Occupational History   Occupation: Retired  Tobacco Use   Smoking status: Former    Types: Cigarettes   Smokeless tobacco: Never  Vaping Use   Vaping status: Never Used  Substance and Sexual Activity   Alcohol use: Yes    Alcohol/week: 0.0 standard drinks of alcohol    Comment: occasional   Drug use: No   Sexual activity: Yes  Other Topics Concern   Not on file  Social History Narrative   Not on file   Social Determinants of Health   Financial Resource Strain: Low Risk  (08/30/2023)   Overall Financial Resource Strain (CARDIA)    Difficulty of Paying Living Expenses: Not hard at all  Food Insecurity: No Food Insecurity (08/30/2023)   Hunger Vital Sign    Worried About Running Out of Food in the Last Year: Never true    Ran Out of Food in the Last Year: Never true  Transportation Needs: No Transportation Needs (08/30/2023)   PRAPARE - Administrator, Civil Service (Medical): No    Lack of Transportation (Non-Medical): No  Physical Activity:  Insufficiently Active (08/30/2023)   Exercise Vital Sign    Days of Exercise per Week: 2 days    Minutes of Exercise per Session: 10 min  Stress: No Stress Concern Present (08/30/2023)   [employer] of Occupational Health - Occupational Stress Questionnaire    Feeling of Stress : Not at all  Social Connections: Moderately Isolated (08/30/2023)   Social Connection and Isolation Panel [NHANES]    Frequency of Communication with Friends and Family: Twice a week    Frequency of Social Gatherings with Friends and Family: Once a week    Attends Religious Services: More than 4 times per year    Active Member of [employer] or Organizations: No    Attends Engineer, structural: Not on file    Marital Status: Divorced  Intimate Partner Violence: Not At Risk (08/10/2022)   Humiliation,  Afraid, Rape, and Kick questionnaire    Fear of Current or Ex-Partner: No    Emotionally Abused: No    Physically Abused: No    Sexually Abused: No     Constitutional: Denies fever, malaise, fatigue, headache or abrupt weight changes.  HEENT: Denies eye pain, eye redness, ear pain, ringing in the ears, wax buildup, runny nose, nasal congestion, bloody nose, or sore throat. Respiratory: Denies difficulty breathing, shortness of breath, cough or sputum production.   Cardiovascular: Denies chest pain, chest tightness, palpitations or swelling in the hands or feet.  Gastrointestinal: Pt reports intermittent reflux. Denies abdominal pain, bloating, constipation, diarrhea or blood in the stool.  GU: Patient reports erectile dysfunction.  Denies urgency, frequency, pain with urination, burning sensation, blood in urine, odor or discharge. Musculoskeletal: Pt reports myalgias. Denies decrease in range of motion, difficulty with gait, or joint pain and swelling.  Skin: Denies redness, rashes, lesions or ulcercations.  Neurological: Denies dizziness, difficulty with memory, difficulty with speech or problems with balance and coordination.  Psych: Denies anxiety, depression, SI/HI.  No other specific complaints in a complete review of systems (except as listed in HPI above).  Objective:   Physical Exam  BP 112/70   Pulse 89   Ht 5\' 9"  (1.753 m)   Wt 190 lb (86.2 kg)   SpO2 97%   BMI 28.06 kg/m   Wt Readings from Last 3 Encounters:  05/27/23 186 lb 3.2 oz (84.5 kg)  11/16/22 183 lb (83 kg)  09/01/22 184 lb (83.5 kg)    General: Appears his stated age, overweight, in NAD. Skin: Warm, dry and intact. No ulcerations noted. HEENT: Head: normal shape and size; Eyes: sclera white, no icterus, conjunctiva pink, PERRLA and EOMs intact;  Neck:  Neck supple, trachea midline. No masses, lumps or thyromegaly present.  Cardiovascular: Normal rate and rhythm. S1,S2 noted.  No murmur, rubs or gallops  noted. No JVD or BLE edema. No carotid bruits noted. Pulmonary/Chest: Normal effort and positive vesicular breath sounds. No respiratory distress. No wheezes, rales or ronchi noted.  Abdomen: Soft and nontender. Normal bowel sounds.  Musculoskeletal: Strength 5/5 BUE/BLE.  No difficulty with gait.  Neurological: Alert and oriented. Cranial nerves II-XII grossly intact. Coordination normal.  Psychiatric: Mood and affect normal. Behavior is normal. Judgment and thought content normal.     BMET    Component Value Date/Time   NA 139 05/27/2023 0927   K 4.4 05/27/2023 0927   CL 105 05/27/2023 0927   CO2 25 05/27/2023 0927   GLUCOSE 129 (H) 05/27/2023 0927   BUN 22 05/27/2023 0927   CREATININE 1.04 05/27/2023  5784   CALCIUM 9.5 05/27/2023 0927    Lipid Panel     Component Value Date/Time   CHOL 88 05/27/2023 0927   TRIG 310 (H) 05/27/2023 0927   HDL 32 (L) 05/27/2023 0927   CHOLHDL 2.8 05/27/2023 0927   VLDL 69.6 (H) 09/14/2019 1353   LDLCALC 23 05/27/2023 0927    CBC    Component Value Date/Time   WBC 8.2 05/27/2023 0927   RBC 4.86 05/27/2023 0927   HGB 14.7 05/27/2023 0927   HCT 43.7 05/27/2023 0927   PLT 266 05/27/2023 0927   MCV 89.9 05/27/2023 0927   MCH 30.2 05/27/2023 0927   MCHC 33.6 05/27/2023 0927   RDW 14.0 05/27/2023 0927    Hgb A1C Lab Results  Component Value Date   HGBA1C 6.5 (A) 05/27/2023            Assessment & Plan:   Preventative health maintenance:  Flu shot declined Tetanus UTD Encouraged him to get his COVID booster Pneumovax and Prevnar 13 UTD, declines Prevnar 20 today Discussed Shingrix vaccine, he will check coverage with his insurance company and schedule visit if he would like to have this done Cologuard ordered Encouraged him to consume a balanced diet and exercise regimen Advised him to see an eye doctor and dentist annually We will check CBC, c-Met, lipid, A1c and PSA today  RTC in 6 months, follow-up chronic  conditions Nicki Reaper, NP

## 2023-09-06 DIAGNOSIS — Z9889 Other specified postprocedural states: Secondary | ICD-10-CM | POA: Diagnosis not present

## 2023-09-06 DIAGNOSIS — H43811 Vitreous degeneration, right eye: Secondary | ICD-10-CM | POA: Diagnosis not present

## 2023-09-06 DIAGNOSIS — H59812 Chorioretinal scars after surgery for detachment, left eye: Secondary | ICD-10-CM | POA: Diagnosis not present

## 2023-09-06 LAB — COMPLETE METABOLIC PANEL WITH GFR
AG Ratio: 1.7 (calc) (ref 1.0–2.5)
ALT: 69 U/L — ABNORMAL HIGH (ref 9–46)
AST: 49 U/L — ABNORMAL HIGH (ref 10–35)
Albumin: 4.6 g/dL (ref 3.6–5.1)
Alkaline phosphatase (APISO): 56 U/L (ref 35–144)
BUN: 22 mg/dL (ref 7–25)
CO2: 26 mmol/L (ref 20–32)
Calcium: 9.7 mg/dL (ref 8.6–10.3)
Chloride: 104 mmol/L (ref 98–110)
Creat: 1.06 mg/dL (ref 0.70–1.28)
Globulin: 2.7 g/dL (ref 1.9–3.7)
Glucose, Bld: 136 mg/dL — ABNORMAL HIGH (ref 65–99)
Potassium: 4.8 mmol/L (ref 3.5–5.3)
Sodium: 140 mmol/L (ref 135–146)
Total Bilirubin: 1.6 mg/dL — ABNORMAL HIGH (ref 0.2–1.2)
Total Protein: 7.3 g/dL (ref 6.1–8.1)
eGFR: 74 mL/min/{1.73_m2} (ref >=60)

## 2023-09-06 LAB — CBC
HCT: 48 % (ref 38.5–50.0)
Hemoglobin: 15.6 g/dL (ref 13.2–17.1)
MCH: 29.1 pg (ref 27.0–33.0)
MCHC: 32.5 g/dL (ref 32.0–36.0)
MCV: 89.4 fL (ref 80.0–100.0)
MPV: 10.5 fL (ref 7.5–12.5)
Platelets: 267 10*3/uL (ref 140–400)
RBC: 5.37 10*6/uL (ref 4.20–5.80)
RDW: 13.7 % (ref 11.0–15.0)
WBC: 9.9 10*3/uL (ref 3.8–10.8)

## 2023-09-06 LAB — LIPID PANEL
Cholesterol: 81 mg/dL (ref <200)
HDL: 38 mg/dL — ABNORMAL LOW (ref >=40)
LDL Cholesterol (Calc): 14 mg/dL
Non-HDL Cholesterol (Calc): 43 mg/dL (ref <130)
Total CHOL/HDL Ratio: 2.1 (calc) (ref <5.0)
Triglycerides: 250 mg/dL — ABNORMAL HIGH (ref <150)

## 2023-09-06 LAB — HEMOGLOBIN A1C
Hgb A1c MFr Bld: 7.1 %{Hb} — ABNORMAL HIGH (ref <5.7)
Mean Plasma Glucose: 157 mg/dL
eAG (mmol/L): 8.7 mmol/L

## 2023-09-06 LAB — PSA: PSA: 1.08 ng/mL (ref <=4.00)

## 2023-09-07 ENCOUNTER — Encounter: Payer: Self-pay | Admitting: Internal Medicine

## 2023-09-12 DIAGNOSIS — Z1211 Encounter for screening for malignant neoplasm of colon: Secondary | ICD-10-CM | POA: Diagnosis not present

## 2023-09-21 LAB — COLOGUARD: COLOGUARD: NEGATIVE

## 2023-10-05 ENCOUNTER — Telehealth: Payer: Self-pay | Admitting: Internal Medicine

## 2023-10-05 NOTE — Telephone Encounter (Signed)
Called LVM 10/05/2023 to schedule Annual Wellness Visit  Verlee Rossetti; Care Guide Ambulatory Clinical Support [place] [facility] Direct Dial: [phone]

## 2023-11-11 ENCOUNTER — Other Ambulatory Visit: Payer: Self-pay | Admitting: Internal Medicine

## 2023-11-15 NOTE — Telephone Encounter (Signed)
 Requested Prescriptions  Pending Prescriptions Disp Refills   metFORMIN  (GLUCOPHAGE ) 1000 MG tablet [Pharmacy Med Name: metFORMIN  HCl Oral Tablet 1000 MG] 180 tablet 0    Sig: TAKE 1 TABLET TWICE DAILY WITH MEALS     Endocrinology:  Diabetes - Biguanides Failed - 11/15/2023  4:21 PM      Failed - B12 Level in normal range and within 720 days    No results found for: VITAMINB12       Failed - CBC within normal limits and completed in the last 12 months    WBC  Date Value Ref Range Status  09/05/2023 9.9 3.8 - 10.8 Thousand/uL Final   RBC  Date Value Ref Range Status  09/05/2023 5.37 4.20 - 5.80 Million/uL Final   Hemoglobin  Date Value Ref Range Status  09/05/2023 15.6 13.2 - 17.1 g/dL Final   HCT  Date Value Ref Range Status  09/05/2023 48.0 38.5 - 50.0 % Final   MCHC  Date Value Ref Range Status  09/05/2023 32.5 32.0 - 36.0 g/dL Final    Comment:    For adults, a slight decrease in the calculated MCHC value (in the range of 30 to 32 g/dL) is most likely not clinically significant; however, it should be interpreted with caution in correlation with other red cell parameters and the patient's clinical condition.    [facility]  Date Value Ref Range Status  09/05/2023 29.1 27.0 - 33.0 pg Final   MCV  Date Value Ref Range Status  09/05/2023 89.4 80.0 - 100.0 fL Final   No results found for: PLTCOUNTKUC, LABPLAT, POCPLA RDW  Date Value Ref Range Status  09/05/2023 13.7 11.0 - 15.0 % Final         Passed - Cr in normal range and within 360 days    Creat  Date Value Ref Range Status  09/05/2023 1.06 0.70 - 1.28 mg/dL Final   Creatinine, Urine  Date Value Ref Range Status  05/27/2023 99 20 - 320 mg/dL Final         Passed - HBA1C is between 0 and 7.9 and within 180 days    Hgb A1c MFr Bld  Date Value Ref Range Status  09/05/2023 7.1 (H) <5.7 % of total Hgb Final    Comment:    For someone without known diabetes, a hemoglobin A1c value of 6.5% or greater  indicates that they may have  diabetes and this should be confirmed with a follow-up  test. . For someone with known diabetes, a value <7% indicates  that their diabetes is well controlled and a value  greater than or equal to 7% indicates suboptimal  control. A1c targets should be individualized based on  duration of diabetes, age, comorbid conditions, and  other considerations. . Currently, no consensus exists regarding use of hemoglobin A1c for diagnosis of diabetes for children. .          Passed - eGFR in normal range and within 360 days    GFR  Date Value Ref Range Status  06/23/2020 61.60 >60.00 mL/min Final   eGFR  Date Value Ref Range Status  09/05/2023 74 > OR = 60 mL/min/1.66m2 Final         Passed - Valid encounter within last 6 months    Recent Outpatient Visits           2 months ago Encounter for general adult medical examination with abnormal findings   [place] [facility] [place], Angeline ORN, NP  5 months ago Type 2 diabetes mellitus with hyperglycemia, without long-term current use of insulin [facility])   [place] [facility] [place], Angeline ORN, NP   12 months ago Allergic dermatitis   [place]  [facility] [place], Angeline ORN, NP   1 year ago Encounter for general adult medical examination with abnormal findings   [place] [facility] [place], [place], NP   1 year ago Type 2 diabetes mellitus with hyperglycemia, without long-term current use of insulin [facility])   [place] [facility] [place], Angeline ORN, NP       Future Appointments             In 3 months Baity, Angeline ORN, NP [place] [facility], [facility]

## 2023-11-17 ENCOUNTER — Telehealth: Payer: Self-pay | Admitting: Internal Medicine

## 2023-11-17 NOTE — Telephone Encounter (Signed)
 Called LVM 11/17/2023 to schedule AWV. Please schedule office or virtual visits.  Verlee Rossetti; Care Guide Ambulatory Clinical Support [place] [facility] Direct Dial: [phone]

## 2023-11-25 DIAGNOSIS — E119 Type 2 diabetes mellitus without complications: Secondary | ICD-10-CM | POA: Diagnosis not present

## 2023-11-25 DIAGNOSIS — Z0100 Encounter for examination of eyes and vision without abnormal findings: Secondary | ICD-10-CM | POA: Diagnosis not present

## 2023-11-25 LAB — HM DIABETES EYE EXAM

## 2023-12-07 DIAGNOSIS — Z0100 Encounter for examination of eyes and vision without abnormal findings: Secondary | ICD-10-CM | POA: Diagnosis not present

## 2023-12-16 ENCOUNTER — Telehealth: Payer: Self-pay | Admitting: Internal Medicine

## 2023-12-16 NOTE — Telephone Encounter (Signed)
Copied from CRM [id number]. Topic: Medicare AWV >> Dec 16, 2023 10:42 AM Payton Doughty wrote: Reason for CRM: Called LVM 12/16/2023 to schedule AWV. Please schedule office or virtual visits.  Verlee Rossetti; Care Guide Ambulatory Clinical Support [place] [facility] Direct Dial: [phone]

## 2023-12-26 ENCOUNTER — Other Ambulatory Visit: Payer: Self-pay | Admitting: Internal Medicine

## 2023-12-27 NOTE — Telephone Encounter (Signed)
Requested Prescriptions  Pending Prescriptions Disp Refills   rosuvastatin (CRESTOR) 20 MG tablet [Pharmacy Med Name: Rosuvastatin Calcium Oral Tablet 20 MG] 90 tablet 1    Sig: TAKE 1 TABLET EVERY DAY     Cardiovascular:  Antilipid - Statins 2 Failed - 12/27/2023  9:18 AM      Failed - Lipid Panel in normal range within the last 12 months    Cholesterol  Date Value Ref Range Status  09/05/2023 81 <200 mg/dL Final   LDL Cholesterol (Calc)  Date Value Ref Range Status  09/05/2023 14 mg/dL (calc) Final    Comment:    Reference range: <100 . Desirable range <100 mg/dL for primary prevention;   <70 mg/dL for patients with CHD or diabetic patients  with > or = 2 CHD risk factors. Marland Kitchen LDL-C is now calculated using the Martin-Hopkins  calculation, which is a validated novel method providing  better accuracy than the Friedewald equation in the  estimation of LDL-C.  Horald Pollen et al. Lenox Ahr. 2956;213(08): 2061-2068  (http://education.[facility].com/faq/FAQ164)    Direct LDL  Date Value Ref Range Status  06/23/2020 40.0 mg/dL Final    Comment:    Optimal:  <100 mg/dLNear or Above Optimal:  100-129 mg/dLBorderline High:  130-159 mg/dLHigh:  160-189 mg/dLVery High:  >190 mg/dL   HDL  Date Value Ref Range Status  09/05/2023 38 (L) > OR = 40 mg/dL Final   Triglycerides  Date Value Ref Range Status  09/05/2023 250 (H) <150 mg/dL Final    Comment:    . If a non-fasting specimen was collected, consider repeat triglyceride testing on a fasting specimen if clinically indicated.  Perry Mount et al. J. of Clin. Lipidol. 2015;9:129-169. Marland Kitchen          Passed - Cr in normal range and within 360 days    Creat  Date Value Ref Range Status  09/05/2023 1.06 0.70 - 1.28 mg/dL Final   Creatinine, Urine  Date Value Ref Range Status  05/27/2023 99 20 - 320 mg/dL Final         Passed - Patient is not pregnant      Passed - Valid encounter within last 12 months    Recent Outpatient Visits            3 months ago Encounter for general adult medical examination with abnormal findings   [place] [facility] [place], [place] W, NP   7 months ago Type 2 diabetes mellitus with hyperglycemia, without long-term current use of insulin [facility])   [place] [facility] [place], Salvadore [place], NP   1 year ago Allergic dermatitis   [place] [facility] [place], Salvadore Oxford, NP   1 year ago Encounter for general adult medical examination with abnormal findings   [place] [facility] [place], [place] W, NP   1 year ago Type 2 diabetes mellitus with hyperglycemia, without long-term current use of insulin [facility])   [facility] [place], Salvadore Oxford, NP       Future Appointments             In 2 months [place], Salvadore Oxford, NP [facility], [facility]

## 2024-01-27 ENCOUNTER — Other Ambulatory Visit: Payer: Self-pay | Admitting: Internal Medicine

## 2024-01-27 NOTE — Telephone Encounter (Signed)
 Requested Prescriptions  Pending Prescriptions Disp Refills   losartan (COZAAR) 100 MG tablet [Pharmacy Med Name: Losartan Potassium Oral Tablet 100 MG] 90 tablet 0    Sig: TAKE 1 TABLET EVERY DAY     Cardiovascular:  Angiotensin Receptor Blockers Passed - 01/27/2024  2:54 PM      Passed - Cr in normal range and within 180 days    Creat  Date Value Ref Range Status  09/05/2023 1.06 0.70 - 1.28 mg/dL Final   Creatinine, Urine  Date Value Ref Range Status  05/27/2023 99 20 - 320 mg/dL Final         Passed - K in normal range and within 180 days    Potassium  Date Value Ref Range Status  09/05/2023 4.8 3.5 - 5.3 mmol/L Final         Passed - Patient is not pregnant      Passed - Last BP in normal range    BP Readings from Last 1 Encounters:  09/05/23 112/70         Passed - Valid encounter within last 6 months    Recent Outpatient Visits           4 months ago Encounter for general adult medical examination with abnormal findings   [facility] [place], Salvadore Oxford, NP   8 months ago Type 2 diabetes mellitus with hyperglycemia, without long-term current use of insulin [facility])   [facility] [place], [place] W, NP   1 year ago Allergic dermatitis   [facility] [place], [place], NP   1 year ago Encounter for general adult medical examination with abnormal findings   [place] [place], Salvadore Oxford, NP   1 year ago Type 2 diabetes mellitus with hyperglycemia, without long-term current use of insulin [facility])   [place] [facility] [place], Salvadore Oxford, NP       Future Appointments             In 1 month [place], Salvadore Oxford, NP [facility]             metFORMIN (GLUCOPHAGE) 1000 MG tablet [Pharmacy Med Name: metFORMIN HCl Oral Tablet 1000 MG] 180 tablet 0    Sig: TAKE 1 TABLET TWICE DAILY WITH MEALS     Endocrinology:  Diabetes -  Biguanides Failed - 01/27/2024  2:54 PM      Failed - B12 Level in normal range and within 720 days    No results found for: "VITAMINB12"       Failed - CBC within normal limits and completed in the last 12 months    WBC  Date Value Ref Range Status  09/05/2023 9.9 3.8 - 10.8 Thousand/uL Final   RBC  Date Value Ref Range Status  09/05/2023 5.37 4.20 - 5.80 Million/uL Final   Hemoglobin  Date Value Ref Range Status  09/05/2023 15.6 13.2 - 17.1 g/dL Final   HCT  Date Value Ref Range Status  09/05/2023 48.0 38.5 - 50.0 % Final   MCHC  Date Value Ref Range Status  09/05/2023 32.5 32.0 - 36.0 g/dL Final    Comment:    For adults, a slight decrease in the calculated MCHC value (in the range of 30 to 32 g/dL) is most likely not clinically significant; however, it should be interpreted with caution in correlation with other red cell parameters  and the patient's clinical condition.    [facility]  Date Value Ref Range Status  09/05/2023 29.1 27.0 - 33.0 pg Final   MCV  Date Value Ref Range Status  09/05/2023 89.4 80.0 - 100.0 fL Final   No results found for: "PLTCOUNTKUC", "LABPLAT", "POCPLA" RDW  Date Value Ref Range Status  09/05/2023 13.7 11.0 - 15.0 % Final         Passed - Cr in normal range and within 360 days    Creat  Date Value Ref Range Status  09/05/2023 1.06 0.70 - 1.28 mg/dL Final   Creatinine, Urine  Date Value Ref Range Status  05/27/2023 99 20 - 320 mg/dL Final         Passed - HBA1C is between 0 and 7.9 and within 180 days    Hgb A1c MFr Bld  Date Value Ref Range Status  09/05/2023 7.1 (H) <5.7 % of total Hgb Final    Comment:    For someone without known diabetes, a hemoglobin A1c value of 6.5% or greater indicates that they may have  diabetes and this should be confirmed with a follow-up  test. . For someone with known diabetes, a value <7% indicates  that their diabetes is well controlled and a value  greater than or equal to 7% indicates  suboptimal  control. A1c targets should be individualized based on  duration of diabetes, age, comorbid conditions, and  other considerations. . Currently, no consensus exists regarding use of hemoglobin A1c for diagnosis of diabetes for children. .          Passed - eGFR in normal range and within 360 days    GFR  Date Value Ref Range Status  06/23/2020 61.60 >60.00 mL/min Final   eGFR  Date Value Ref Range Status  09/05/2023 74 > OR = 60 mL/min/1.69m2 Final         Passed - Valid encounter within last 6 months    Recent Outpatient Visits           4 months ago Encounter for general adult medical examination with abnormal findings   [place] [facility] [place], [place], NP   8 months ago Type 2 diabetes mellitus with hyperglycemia, without long-term current use of insulin [facility])   [place] [facility] [place], Salvadore Oxford, NP   1 year ago Allergic dermatitis   [facility] [place], Salvadore Oxford, NP   1 year ago Encounter for general adult medical examination with abnormal findings   [place] [place], [place], NP   1 year ago Type 2 diabetes mellitus with hyperglycemia, without long-term current use of insulin [facility])   [place] [facility] [place], Salvadore Oxford, NP       Future Appointments             In 1 month [place], Salvadore Oxford, NP  [facility], [facility]

## 2024-03-08 ENCOUNTER — Encounter: Payer: Self-pay | Admitting: Internal Medicine

## 2024-03-08 ENCOUNTER — Ambulatory Visit (INDEPENDENT_AMBULATORY_CARE_PROVIDER_SITE_OTHER): Payer: Self-pay | Admitting: Internal Medicine

## 2024-03-08 VITALS — BP 124/78 | Ht 69.0 in | Wt 188.4 lb

## 2024-03-08 DIAGNOSIS — E785 Hyperlipidemia, unspecified: Secondary | ICD-10-CM

## 2024-03-08 DIAGNOSIS — E1169 Type 2 diabetes mellitus with other specified complication: Secondary | ICD-10-CM | POA: Diagnosis not present

## 2024-03-08 DIAGNOSIS — I10 Essential (primary) hypertension: Secondary | ICD-10-CM

## 2024-03-08 DIAGNOSIS — Z6827 Body mass index (BMI) 27.0-27.9, adult: Secondary | ICD-10-CM

## 2024-03-08 DIAGNOSIS — E1165 Type 2 diabetes mellitus with hyperglycemia: Secondary | ICD-10-CM

## 2024-03-08 DIAGNOSIS — J410 Simple chronic bronchitis: Secondary | ICD-10-CM | POA: Diagnosis not present

## 2024-03-08 DIAGNOSIS — E119 Type 2 diabetes mellitus without complications: Secondary | ICD-10-CM

## 2024-03-08 DIAGNOSIS — E663 Overweight: Secondary | ICD-10-CM

## 2024-03-08 DIAGNOSIS — N522 Drug-induced erectile dysfunction: Secondary | ICD-10-CM

## 2024-03-08 DIAGNOSIS — Z23 Encounter for immunization: Secondary | ICD-10-CM

## 2024-03-08 DIAGNOSIS — Z7984 Long term (current) use of oral hypoglycemic drugs: Secondary | ICD-10-CM

## 2024-03-08 NOTE — Patient Instructions (Signed)
 Health Maintenance, Male  Adopting a healthy lifestyle and getting preventive care are important in promoting health and wellness. Ask your health care provider about:  The right schedule for you to have regular tests and exams.  Things you can do on your own to prevent diseases and keep yourself healthy.  What should I know about diet, weight, and exercise?  Eat a healthy diet    Eat a diet that includes plenty of vegetables, fruits, low-fat dairy products, and lean protein.  Do not eat a lot of foods that are high in solid fats, added sugars, or sodium.  Maintain a healthy weight  Body mass index (BMI) is a measurement that can be used to identify possible weight problems. It estimates body fat based on height and weight. Your health care provider can help determine your BMI and help you achieve or maintain a healthy weight.  Get regular exercise  Get regular exercise. This is one of the most important things you can do for your health. Most adults should:  Exercise for at least 150 minutes each week. The exercise should increase your heart rate and make you sweat (moderate-intensity exercise).  Do strengthening exercises at least twice a week. This is in addition to the moderate-intensity exercise.  Spend less time sitting. Even light physical activity can be beneficial.  Watch cholesterol and blood lipids  Have your blood tested for lipids and cholesterol at 74 years of age, then have this test every 5 years.  You may need to have your cholesterol levels checked more often if:  Your lipid or cholesterol levels are high.  You are older than 74 years of age.  You are at high risk for heart disease.  What should I know about cancer screening?  Many types of cancers can be detected early and may often be prevented. Depending on your health history and family history, you may need to have cancer screening at various ages. This may include screening for:  Colorectal cancer.  Prostate cancer.  Skin cancer.  Lung  cancer.  What should I know about heart disease, diabetes, and high blood pressure?  Blood pressure and heart disease  High blood pressure causes heart disease and increases the risk of stroke. This is more likely to develop in people who have high blood pressure readings or are overweight.  Talk with your health care provider about your target blood pressure readings.  Have your blood pressure checked:  Every 3-5 years if you are 9-95 years of age.  Every year if you are 85 years old or older.  If you are between the ages of 29 and 29 and are a current or former smoker, ask your health care provider if you should have a one-time screening for abdominal aortic aneurysm (AAA).  Diabetes  Have regular diabetes screenings. This checks your fasting blood sugar level. Have the screening done:  Once every three years after age 23 if you are at a normal weight and have a low risk for diabetes.  More often and at a younger age if you are overweight or have a high risk for diabetes.  What should I know about preventing infection?  Hepatitis B  If you have a higher risk for hepatitis B, you should be screened for this virus. Talk with your health care provider to find out if you are at risk for hepatitis B infection.  Hepatitis C  Blood testing is recommended for:  Everyone born from [birth year] through 1966.  Anyone  with known risk factors for hepatitis C.  Sexually transmitted infections (STIs)  You should be screened each year for STIs, including gonorrhea and chlamydia, if:  You are sexually active and are younger than 74 years of age.  You are older than 74 years of age and your health care provider tells you that you are at risk for this type of infection.  Your sexual activity has changed since you were last screened, and you are at increased risk for chlamydia or gonorrhea. Ask your health care provider if you are at risk.  Ask your health care provider about whether you are at high risk for HIV. Your health care provider  may recommend a prescription medicine to help prevent HIV infection. If you choose to take medicine to prevent HIV, you should first get tested for HIV. You should then be tested every 3 months for as long as you are taking the medicine.  Follow these instructions at home:  Alcohol use  Do not drink alcohol if your health care provider tells you not to drink.  If you drink alcohol:  Limit how much you have to 0-2 drinks a day.  Know how much alcohol is in your drink. In the [place] one drink equals one 12 oz bottle of beer (355 mL), one 5 oz glass of wine (148 mL), or one 1 oz glass of hard liquor (44 mL).  Lifestyle  Do not use any products that contain nicotine or tobacco. These products include cigarettes, chewing tobacco, and vaping devices, such as e-cigarettes. If you need help quitting, ask your health care provider.  Do not use street drugs.  Do not share needles.  Ask your health care provider for help if you need support or information about quitting drugs.  General instructions  Schedule regular health, dental, and eye exams.  Stay current with your vaccines.  Tell your health care provider if:  You often feel depressed.  You have ever been abused or do not feel safe at home.  Summary  Adopting a healthy lifestyle and getting preventive care are important in promoting health and wellness.  Follow your health care provider's instructions about healthy diet, exercising, and getting tested or screened for diseases.  Follow your health care provider's instructions on monitoring your cholesterol and blood pressure.  This information is not intended to replace advice given to you by your health care provider. Make sure you discuss any questions you have with your health care provider.  Document Revised: 03/23/2021 Document Reviewed: 03/23/2021  Elsevier Patient Education  2024 [employer].

## 2024-03-08 NOTE — Addendum Note (Signed)
 Addended by: Arabella Knife D on: 03/08/2024 09:40 AM   Modules accepted: Orders

## 2024-03-08 NOTE — Assessment & Plan Note (Signed)
Asymptomatic off inhalers We will monitor

## 2024-03-08 NOTE — Assessment & Plan Note (Signed)
 A1c today Urine microalbumin has been checked within the last year Continue metformin  Encourage low-carb diet and exercise for weight loss Encouraged routine eye exam Encouraged routine foot exam Prevnar 20 today Encourage COVID booster

## 2024-03-08 NOTE — Assessment & Plan Note (Signed)
 C-Met and lipid profile today Encouraged him to consume a low-fat diet Continue rosuvastatin , fish oil and ezetimibe 

## 2024-03-08 NOTE — Progress Notes (Signed)
 Subjective:    Patient ID: Charles Smith, male    DOB: 08/01/1951, 73 y.o.   MRN: [id number]  HPI  Patient presents to clinic today for follow-up of chronic conditions.  HTN: His BP today is 124/778.  He is taking losartan  as prescribed.  There is no ECG on file.  HLD: His last LDL was 14, triglycerides 956, 08/2023.  He denies myalgias on rosuvastatin , fish oil and ezetimibe .  He does not consume a low-fat diet.  ED: Managed with sildenafil  as needed.  He does not follow with urology.  COPD: He denies chronic cough or shortness of breath.  He is not using any inhalers at this time.  There are no PFTs on file.  He does not smoke.  DM2: His last A1c was 7.1%, 08/2023.  He is taking metformin  as prescribed.  His sugars range 121-136.  He checks his feet routinely.  His last eye exam was 11/2023.  Flu 07/2022.  Pneumovax 11/2016.  Prevnar 04/2015.  COVID x 2.  Review of Systems     Past Medical History:  Diagnosis Date  . History of chicken pox     Current Outpatient Medications  Medication Sig Dispense Refill  . Ascorbic Acid (VITAMIN C) 1000 MG tablet Take 1,000 mg by mouth daily.    . ASPIRIN  LOW DOSE 81 MG tablet TAKE 1 TABLET EVERY DAY SWALLOW WHOLE 90 tablet 0  . blood glucose meter kit and supplies Check blood sugar twice daily 1 each 0  . ezetimibe  (ZETIA ) 10 MG tablet TAKE 1 TABLET EVERY DAY (SCHEDULE A FOLLOW UP APPOINTMENT PRIOR TO FURTHER REFILLS) 90 tablet 3  . losartan  (COZAAR ) 100 MG tablet TAKE 1 TABLET EVERY DAY 90 tablet 0  . metFORMIN  (GLUCOPHAGE ) 1000 MG tablet TAKE 1 TABLET TWICE DAILY WITH MEALS 180 tablet 0  . Multiple Vitamins-Minerals (CENTRUM ADULTS PO) Take 1 capsule by mouth daily.    . Omega-3 Fatty Acids (FISH OIL) 1000 MG CAPS Take 1,000 mg by mouth daily.     . rosuvastatin  (CRESTOR ) 20 MG tablet TAKE 1 TABLET EVERY DAY 90 tablet 1  . sildenafil  (VIAGRA ) 50 MG tablet TAKE 1/2 TABLET (25 MG TOTAL) BY MOUTH DAILY AS NEEDED FOR ERECTILE DYSFUNCTION.  90 tablet 0   No current facility-administered medications for this visit.    No Known Allergies  Family History  Problem Relation Age of Onset  . Hyperlipidemia Sister   . Heart disease Brother        heart transplant  . Kidney cancer Brother   . Depression Neg Hx   . Stroke Neg Hx     Social History   Socioeconomic History  . Marital status: Divorced    Spouse name: Not on file  . Number of children: 3  . Years of education: Not on file  . Highest education level: 12th grade  Occupational History  . Occupation: Retired  Tobacco Use  . Smoking status: Former    Types: Cigarettes  . Smokeless tobacco: Never  Vaping Use  . Vaping status: Never Used  Substance and Sexual Activity  . Alcohol use: Yes    Alcohol/week: 0.0 standard drinks of alcohol    Comment: occasional  . Drug use: No  . Sexual activity: Yes  Other Topics Concern  . Not on file  Social History Narrative  . Not on file   Social Drivers of Health   Financial Resource Strain: Low Risk  (03/05/2024)   Overall Physicist, medical Strain (  CARDIA)   . Difficulty of Paying Living Expenses: Not hard at all  Food Insecurity: No Food Insecurity (03/05/2024)   Hunger Vital Sign   . Worried About Programme researcher, broadcasting/film/video in the Last Year: Never true   . Ran Out of Food in the Last Year: Never true  Transportation Needs: No Transportation Needs (03/05/2024)   PRAPARE - Transportation   . Lack of Transportation (Medical): No   . Lack of Transportation (Non-Medical): No  Physical Activity: Insufficiently Active (03/05/2024)   Exercise Vital Sign   . Days of Exercise per Week: 2 days   . Minutes of Exercise per Session: 10 min  Stress: No Stress Concern Present (03/05/2024)   [employer] of Occupational Health - Occupational Stress Questionnaire   . Feeling of Stress : Not at all  Social Connections: Moderately Isolated (03/05/2024)   Social Connection and Isolation Panel [NHANES]   . Frequency of  Communication with Friends and Family: Twice a week   . Frequency of Social Gatherings with Friends and Family: Twice a week   . Attends Religious Services: More than 4 times per year   . Active Member of Clubs or Organizations: No   . Attends Banker Meetings: Not on file   . Marital Status: Divorced  Catering manager Violence: Not At Risk (08/10/2022)   Humiliation, Afraid, Rape, and Kick questionnaire   . Fear of Current or Ex-Partner: No   . Emotionally Abused: No   . Physically Abused: No   . Sexually Abused: No     Constitutional: Denies fever, malaise, fatigue, headache or abrupt weight changes.  HEENT: Denies eye pain, eye redness, ear pain, ringing in the ears, wax buildup, runny nose, nasal congestion, bloody nose, or sore throat. Respiratory: Denies difficulty breathing, shortness of breath, cough or sputum production.   Cardiovascular: Denies chest pain, chest tightness, palpitations or swelling in the hands or feet.  Gastrointestinal: Denies abdominal pain, bloating, constipation, diarrhea or blood in the stool.  GU: Patient reports erectile dysfunction.  Denies urgency, frequency, pain with urination, burning sensation, blood in urine, odor or discharge. Musculoskeletal: Denies decrease in range of motion, difficulty with gait, muscle pain or joint pain and swelling.  Skin: Denies redness, rashes, lesions or ulcercations.  Neurological: Denies dizziness, difficulty with memory, difficulty with speech or problems with balance and coordination.  Psych: Denies anxiety, depression, SI/HI.  No other specific complaints in a complete review of systems (except as listed in HPI above).  Objective:   Physical Exam  BP 124/78 (BP Location: Left Arm, Patient Position: Sitting, Cuff Size: Normal)   Ht 5\' 9"  (1.753 m)   Wt 188 lb 6.4 oz (85.5 kg)   BMI 27.82 kg/m    Wt Readings from Last 3 Encounters:  09/05/23 190 lb (86.2 kg)  05/27/23 186 lb 3.2 oz (84.5 kg)   11/16/22 183 lb (83 kg)    General: Appears his stated age, overweight, in NAD. Skin: Warm, dry and intact. No ulcerations noted. HEENT: Head: normal shape and size; Eyes: sclera white, no icterus, conjunctiva pink, PERRLA and EOMs intact;  Cardiovascular: Normal rate and rhythm. S1,S2 noted.  No murmur, rubs or gallops noted. No JVD or BLE edema. No carotid bruits noted. Pulmonary/Chest: Normal effort and positive vesicular breath sounds. No respiratory distress. No wheezes, rales or ronchi noted.  Musculoskeletal:  No difficulty with gait.  Neurological: Alert and oriented. Coordination normal.  Psychiatric: Mood and affect normal. Behavior is normal. Judgment and thought  content normal.    BMET    Component Value Date/Time   NA 140 09/05/2023 1054   K 4.8 09/05/2023 1054   CL 104 09/05/2023 1054   CO2 26 09/05/2023 1054   GLUCOSE 136 (H) 09/05/2023 1054   BUN 22 09/05/2023 1054   CREATININE 1.06 09/05/2023 1054   CALCIUM  9.7 09/05/2023 1054    Lipid Panel     Component Value Date/Time   CHOL 81 09/05/2023 1054   TRIG 250 (H) 09/05/2023 1054   HDL 38 (L) 09/05/2023 1054   CHOLHDL 2.1 09/05/2023 1054   VLDL 69.6 (H) 09/14/2019 1353   LDLCALC 14 09/05/2023 1054    CBC    Component Value Date/Time   WBC 9.9 09/05/2023 1054   RBC 5.37 09/05/2023 1054   HGB 15.6 09/05/2023 1054   HCT 48.0 09/05/2023 1054   PLT 267 09/05/2023 1054   MCV 89.4 09/05/2023 1054   MCH 29.1 09/05/2023 1054   MCHC 32.5 09/05/2023 1054   RDW 13.7 09/05/2023 1054    Hgb A1C Lab Results  Component Value Date   HGBA1C 7.1 (H) 09/05/2023           Assessment & Plan:     RTC in 6 months for your annual exam Helayne Lo, NP

## 2024-03-08 NOTE — Assessment & Plan Note (Signed)
 Continue sildenafil as needed.

## 2024-03-08 NOTE — Assessment & Plan Note (Signed)
Controlled on losartan Reinforced DASH diet and exercise for weight loss C-Met today

## 2024-03-08 NOTE — Assessment & Plan Note (Signed)
 Encouraged diet and exercise for weight loss ?

## 2024-03-09 ENCOUNTER — Encounter: Payer: Self-pay | Admitting: Internal Medicine

## 2024-03-09 LAB — COMPREHENSIVE METABOLIC PANEL WITH GFR
AG Ratio: 1.8 (calc) (ref 1.0–2.5)
ALT: 54 U/L — ABNORMAL HIGH (ref 9–46)
AST: 42 U/L — ABNORMAL HIGH (ref 10–35)
Albumin: 4.4 g/dL (ref 3.6–5.1)
Alkaline phosphatase (APISO): 48 U/L (ref 35–144)
BUN: 22 mg/dL (ref 7–25)
CO2: 22 mmol/L (ref 20–32)
Calcium: 9.2 mg/dL (ref 8.6–10.3)
Chloride: 107 mmol/L (ref 98–110)
Creat: 1.15 mg/dL (ref 0.70–1.28)
Globulin: 2.4 g/dL (ref 1.9–3.7)
Glucose, Bld: 135 mg/dL — ABNORMAL HIGH (ref 65–99)
Potassium: 4.4 mmol/L (ref 3.5–5.3)
Sodium: 140 mmol/L (ref 135–146)
Total Bilirubin: 1 mg/dL (ref 0.2–1.2)
Total Protein: 6.8 g/dL (ref 6.1–8.1)
eGFR: 67 mL/min/{1.73_m2} (ref >=60)

## 2024-03-09 LAB — CBC
HCT: 43.7 % (ref 38.5–50.0)
Hemoglobin: 14.4 g/dL (ref 13.2–17.1)
MCH: 29.4 pg (ref 27.0–33.0)
MCHC: 33 g/dL (ref 32.0–36.0)
MCV: 89.4 fL (ref 80.0–100.0)
MPV: 11 fL (ref 7.5–12.5)
Platelets: 250 10*3/uL (ref 140–400)
RBC: 4.89 10*6/uL (ref 4.20–5.80)
RDW: 14.5 % (ref 11.0–15.0)
WBC: 10 10*3/uL (ref 3.8–10.8)

## 2024-03-09 LAB — LIPID PANEL
Cholesterol: 102 mg/dL (ref <200)
HDL: 39 mg/dL — ABNORMAL LOW (ref >=40)
LDL Cholesterol (Calc): 31 mg/dL
Non-HDL Cholesterol (Calc): 63 mg/dL (ref <130)
Total CHOL/HDL Ratio: 2.6 (calc) (ref <5.0)
Triglycerides: 308 mg/dL — ABNORMAL HIGH (ref <150)

## 2024-03-09 LAB — HEMOGLOBIN A1C
Hgb A1c MFr Bld: 7.1 % — ABNORMAL HIGH (ref <5.7)
Mean Plasma Glucose: 157 mg/dL
eAG (mmol/L): 8.7 mmol/L

## 2024-04-09 ENCOUNTER — Other Ambulatory Visit: Payer: Self-pay | Admitting: Internal Medicine

## 2024-04-12 NOTE — Telephone Encounter (Signed)
 Requested Prescriptions  Pending Prescriptions Disp Refills   losartan  (COZAAR ) 100 MG tablet [Pharmacy Med Name: Losartan  Potassium Oral Tablet 100 MG] 90 tablet 1    Sig: TAKE 1 TABLET EVERY DAY     Cardiovascular:  Angiotensin Receptor Blockers Passed - 04/12/2024  8:34 AM      Passed - Cr in normal range and within 180 days    Creat  Date Value Ref Range Status  03/08/2024 1.15 0.70 - 1.28 mg/dL Final   Creatinine, Urine  Date Value Ref Range Status  05/27/2023 99 20 - 320 mg/dL Final         Passed - K in normal range and within 180 days    Potassium  Date Value Ref Range Status  03/08/2024 4.4 3.5 - 5.3 mmol/L Final         Passed - Patient is not pregnant      Passed - Last BP in normal range    BP Readings from Last 1 Encounters:  03/08/24 124/78         Passed - Valid encounter within last 6 months    Recent Outpatient Visits           1 month ago Type 2 diabetes mellitus with hyperglycemia, without long-term current use of insulin (HCC)   [facility] [place], Rankin Buzzard, NP               metFORMIN  (GLUCOPHAGE ) 1000 MG tablet [Pharmacy Med Name: metFORMIN  HCl Oral Tablet 1000 MG] 180 tablet 1    Sig: TAKE 1 TABLET TWICE DAILY WITH MEALS     Endocrinology:  Diabetes - Biguanides Failed - 04/12/2024  8:34 AM      Failed - B12 Level in normal range and within 720 days    No results found for: "VITAMINB12"       Failed - CBC within normal limits and completed in the last 12 months    WBC  Date Value Ref Range Status  03/08/2024 10.0 3.8 - 10.8 Thousand/uL Final   RBC  Date Value Ref Range Status  03/08/2024 4.89 4.20 - 5.80 Million/uL Final   Hemoglobin  Date Value Ref Range Status  03/08/2024 14.4 13.2 - 17.1 g/dL Final   HCT  Date Value Ref Range Status  03/08/2024 43.7 38.5 - 50.0 % Final   MCHC  Date Value Ref Range Status  03/08/2024 33.0 32.0 - 36.0 g/dL Final    Comment:    For adults, a slight decrease in the  calculated MCHC value (in the range of 30 to 32 g/dL) is most likely not clinically significant; however, it should be interpreted with caution in correlation with other red cell parameters and the patient's clinical condition.    [facility]  Date Value Ref Range Status  03/08/2024 29.4 27.0 - 33.0 pg Final   MCV  Date Value Ref Range Status  03/08/2024 89.4 80.0 - 100.0 fL Final   No results found for: "PLTCOUNTKUC", "LABPLAT", "POCPLA" RDW  Date Value Ref Range Status  03/08/2024 14.5 11.0 - 15.0 % Final         Passed - Cr in normal range and within 360 days    Creat  Date Value Ref Range Status  03/08/2024 1.15 0.70 - 1.28 mg/dL Final   Creatinine, Urine  Date Value Ref Range Status  05/27/2023 99 20 - 320 mg/dL Final         Passed - HBA1C is between 0 and  7.9 and within 180 days    Hgb A1c MFr Bld  Date Value Ref Range Status  03/08/2024 7.1 (H) <5.7 % Final    Comment:    For someone without known diabetes, a hemoglobin A1c value of 6.5% or greater indicates that they may have  diabetes and this should be confirmed with a follow-up  test. . For someone with known diabetes, a value <7% indicates  that their diabetes is well controlled and a value  greater than or equal to 7% indicates suboptimal  control. A1c targets should be individualized based on  duration of diabetes, age, comorbid conditions, and  other considerations. . Currently, no consensus exists regarding use of hemoglobin A1c for diagnosis of diabetes for children. .          Passed - eGFR in normal range and within 360 days    GFR  Date Value Ref Range Status  06/23/2020 61.60 >60.00 mL/min Final   eGFR  Date Value Ref Range Status  03/08/2024 67 > OR = 60 mL/min/1.71m2 Final         Passed - Valid encounter within last 6 months    Recent Outpatient Visits           1 month ago Type 2 diabetes mellitus with hyperglycemia, without long-term current use of insulin [facility])   [place]  [facility] [place], Rankin Buzzard, [place]

## 2024-05-21 ENCOUNTER — Other Ambulatory Visit: Payer: Self-pay | Admitting: Internal Medicine

## 2024-05-22 NOTE — Telephone Encounter (Signed)
 Requested by interface surescripts. Future visit on 09/05/24 Requested Prescriptions  Pending Prescriptions Disp Refills   ezetimibe  (ZETIA ) 10 MG tablet [Pharmacy Med Name: Ezetimibe  Oral Tablet 10 MG] 90 tablet 0    Sig: TAKE 1 TABLET EVERY DAY (SCHEDULE A FOLLOW UP APPOINTMENT PRIOR TO FURTHER REFILLS)     Cardiovascular:  Antilipid - Sterol Transport Inhibitors Failed - 05/22/2024  4:14 PM      Failed - AST in normal range and within 360 days    AST  Date Value Ref Range Status  03/08/2024 42 (H) 10 - 35 U/L Final         Failed - ALT in normal range and within 360 days    ALT  Date Value Ref Range Status  03/08/2024 54 (H) 9 - 46 U/L Final         Failed - Lipid Panel in normal range within the last 12 months    Cholesterol  Date Value Ref Range Status  03/08/2024 102 <200 mg/dL Final   LDL Cholesterol (Calc)  Date Value Ref Range Status  03/08/2024 31 mg/dL (calc) Final    Comment:    Reference range: <100 . Desirable range <100 mg/dL for primary prevention;   <70 mg/dL for patients with CHD or diabetic patients  with > or = 2 CHD risk factors. SABRA LDL-C is now calculated using the Martin-Hopkins  calculation, which is a validated novel method providing  better accuracy than the Friedewald equation in the  estimation of LDL-C.  Gladis APPLETHWAITE et al. SANDREA. 7986;689(80): 2061-2068  (http://education.[facility].com/faq/FAQ164)    Direct LDL  Date Value Ref Range Status  06/23/2020 40.0 mg/dL Final    Comment:    Optimal:  <100 mg/dLNear or Above Optimal:  100-129 mg/dLBorderline High:  130-159 mg/dLHigh:  160-189 mg/dLVery High:  >190 mg/dL   HDL  Date Value Ref Range Status  03/08/2024 39 (L) > OR = 40 mg/dL Final   Triglycerides  Date Value Ref Range Status  03/08/2024 308 (H) <150 mg/dL Final    Comment:    . If a non-fasting specimen was collected, consider repeat triglyceride testing on a fasting specimen if clinically indicated.  Veatrice et al. J.  of Clin. Lipidol. 2015;9:129-169. SABRA          Passed - Patient is not pregnant      Passed - Valid encounter within last 12 months    Recent Outpatient Visits           2 months ago Type 2 diabetes mellitus with hyperglycemia, without long-term current use of insulin [facility])   [place] [place], [place], NP               rosuvastatin  (CRESTOR ) 20 MG tablet [Pharmacy Med Name: Rosuvastatin  Calcium  Oral Tablet 20 MG] 90 tablet 3    Sig: TAKE 1 TABLET EVERY DAY     Cardiovascular:  Antilipid - Statins 2 Failed - 05/22/2024  4:14 PM      Failed - Lipid Panel in normal range within the last 12 months    Cholesterol  Date Value Ref Range Status  03/08/2024 102 <200 mg/dL Final   LDL Cholesterol (Calc)  Date Value Ref Range Status  03/08/2024 31 mg/dL (calc) Final    Comment:    Reference range: <100 . Desirable range <100 mg/dL for primary prevention;   <70 mg/dL for patients with CHD or diabetic patients  with > or = 2 CHD risk  factors. SABRA LDL-C is now calculated using the Martin-Hopkins  calculation, which is a validated novel method providing  better accuracy than the Friedewald equation in the  estimation of LDL-C.  Gladis APPLETHWAITE et al. SANDREA. 7986;689(80): 2061-2068  (http://education.[facility].com/faq/FAQ164)    Direct LDL  Date Value Ref Range Status  06/23/2020 40.0 mg/dL Final    Comment:    Optimal:  <100 mg/dLNear or Above Optimal:  100-129 mg/dLBorderline High:  130-159 mg/dLHigh:  160-189 mg/dLVery High:  >190 mg/dL   HDL  Date Value Ref Range Status  03/08/2024 39 (L) > OR = 40 mg/dL Final   Triglycerides  Date Value Ref Range Status  03/08/2024 308 (H) <150 mg/dL Final    Comment:    . If a non-fasting specimen was collected, consider repeat triglyceride testing on a fasting specimen if clinically indicated.  Veatrice et al. J. of Clin. Lipidol. 2015;9:129-169. SABRA          Passed - Cr in normal range and within  360 days    Creat  Date Value Ref Range Status  03/08/2024 1.15 0.70 - 1.28 mg/dL Final   Creatinine, Urine  Date Value Ref Range Status  05/27/2023 99 20 - 320 mg/dL Final         Passed - Patient is not pregnant      Passed - Valid encounter within last 12 months    Recent Outpatient Visits           2 months ago Type 2 diabetes mellitus with hyperglycemia, without long-term current use of insulin [facility])   [facility] [place], Angeline ORN, [place]

## 2024-08-03 ENCOUNTER — Other Ambulatory Visit: Payer: Self-pay | Admitting: Internal Medicine

## 2024-08-03 NOTE — Telephone Encounter (Signed)
 Requested Prescriptions  Pending Prescriptions Disp Refills   ezetimibe  (ZETIA ) 10 MG tablet [Pharmacy Med Name: EZETIMIBE  10 MG Oral Tablet] 90 tablet 1    Sig: TAKE 1 TABLET EVERY DAY (SCHEDULE A FOLLOW UP APPOINTMENT PRIOR TO FURTHER REFILLS)     Cardiovascular:  Antilipid - Sterol Transport Inhibitors Failed - 08/03/2024  2:55 PM      Failed - AST in normal range and within 360 days    AST  Date Value Ref Range Status  03/08/2024 42 (H) 10 - 35 U/L Final         Failed - ALT in normal range and within 360 days    ALT  Date Value Ref Range Status  03/08/2024 54 (H) 9 - 46 U/L Final         Failed - Lipid Panel in normal range within the last 12 months    Cholesterol  Date Value Ref Range Status  03/08/2024 102 <200 mg/dL Final   LDL Cholesterol (Calc)  Date Value Ref Range Status  03/08/2024 31 mg/dL (calc) Final    Comment:    Reference range: <100 . Desirable range <100 mg/dL for primary prevention;   <70 mg/dL for patients with CHD or diabetic patients  with > or = 2 CHD risk factors. SABRA LDL-C is now calculated using the Martin-Hopkins  calculation, which is a validated novel method providing  better accuracy than the Friedewald equation in the  estimation of LDL-C.  Gladis APPLETHWAITE et al. SANDREA. 7986;689(80): 2061-2068  (http://education.[facility].com/faq/FAQ164)    Direct LDL  Date Value Ref Range Status  06/23/2020 40.0 mg/dL Final    Comment:    Optimal:  <100 mg/dLNear or Above Optimal:  100-129 mg/dLBorderline High:  130-159 mg/dLHigh:  160-189 mg/dLVery High:  >190 mg/dL   HDL  Date Value Ref Range Status  03/08/2024 39 (L) > OR = 40 mg/dL Final   Triglycerides  Date Value Ref Range Status  03/08/2024 308 (H) <150 mg/dL Final    Comment:    . If a non-fasting specimen was collected, consider repeat triglyceride testing on a fasting specimen if clinically indicated.  Veatrice et al. J. of Clin. Lipidol. 2015;9:129-169. SABRA          Passed -  Patient is not pregnant      Passed - Valid encounter within last 12 months    Recent Outpatient Visits           4 months ago Type 2 diabetes mellitus with hyperglycemia, without long-term current use of insulin [facility])   [place] [facility] [place], Angeline ORN, [place]

## 2024-08-09 DIAGNOSIS — H43811 Vitreous degeneration, right eye: Secondary | ICD-10-CM | POA: Diagnosis not present

## 2024-08-09 DIAGNOSIS — H25043 Posterior subcapsular polar age-related cataract, bilateral: Secondary | ICD-10-CM | POA: Diagnosis not present

## 2024-08-09 DIAGNOSIS — H18413 Arcus senilis, bilateral: Secondary | ICD-10-CM | POA: Diagnosis not present

## 2024-08-09 DIAGNOSIS — H2513 Age-related nuclear cataract, bilateral: Secondary | ICD-10-CM | POA: Diagnosis not present

## 2024-08-09 DIAGNOSIS — H2512 Age-related nuclear cataract, left eye: Secondary | ICD-10-CM | POA: Diagnosis not present

## 2024-08-09 DIAGNOSIS — H35371 Puckering of macula, right eye: Secondary | ICD-10-CM | POA: Diagnosis not present

## 2024-08-22 NOTE — Progress Notes (Signed)
 Charles Smith                                          MRN: [id number]   08/22/2024   The [facility]BCI Quality Team Specialist reviewed this patient medical record for the purposes of chart review for care gap closure. The following were reviewed: chart review for care gap closure-kidney health evaluation for diabetes:eGFR  and uACR.    VBCI Quality Team

## 2024-09-05 ENCOUNTER — Encounter: Payer: Self-pay | Admitting: Internal Medicine

## 2024-09-05 ENCOUNTER — Ambulatory Visit: Admitting: Internal Medicine

## 2024-09-05 ENCOUNTER — Other Ambulatory Visit: Payer: Self-pay | Admitting: Internal Medicine

## 2024-09-05 VITALS — BP 130/74 | Ht 69.0 in | Wt 192.0 lb

## 2024-09-05 DIAGNOSIS — Z6828 Body mass index (BMI) 28.0-28.9, adult: Secondary | ICD-10-CM | POA: Diagnosis not present

## 2024-09-05 DIAGNOSIS — E1165 Type 2 diabetes mellitus with hyperglycemia: Secondary | ICD-10-CM

## 2024-09-05 DIAGNOSIS — Z7984 Long term (current) use of oral hypoglycemic drugs: Secondary | ICD-10-CM | POA: Diagnosis not present

## 2024-09-05 DIAGNOSIS — E663 Overweight: Secondary | ICD-10-CM

## 2024-09-05 DIAGNOSIS — Z0001 Encounter for general adult medical examination with abnormal findings: Secondary | ICD-10-CM

## 2024-09-05 DIAGNOSIS — Z23 Encounter for immunization: Secondary | ICD-10-CM

## 2024-09-05 DIAGNOSIS — Z125 Encounter for screening for malignant neoplasm of prostate: Secondary | ICD-10-CM | POA: Diagnosis not present

## 2024-09-05 NOTE — Progress Notes (Signed)
 Subjective:    Patient ID: Charles Smith, male    DOB: 29-Jun-1951, 73 y.o.   MRN: [id number]  HPI  Patient presents to clinic today for his annual exam.  Flu: 07/2022 Tetanus: 04/2015 COVID: X 3 Pneumovax: 11/2016 Prevnar 20: 02/2024 Shingrix: Never PSA screening: 08/2023 Colon screening: 08/2023, Cologuard Vision screening: annually Dentist: biannually  Diet: He does eat meat. He consumes fruits and veggies. He does eat some fried foods. He drinks mostly water, unsweet tea. Exercise: Walking   Review of Systems     Past Medical History:  Diagnosis Date   History of chicken pox     Current Outpatient Medications  Medication Sig Dispense Refill   Ascorbic Acid (VITAMIN C) 1000 MG tablet Take 1,000 mg by mouth daily.     ASPIRIN  LOW DOSE 81 MG tablet TAKE 1 TABLET EVERY DAY SWALLOW WHOLE 90 tablet 0   blood glucose meter kit and supplies Check blood sugar twice daily 1 each 0   ezetimibe  (ZETIA ) 10 MG tablet TAKE 1 TABLET EVERY DAY (SCHEDULE A FOLLOW UP APPOINTMENT PRIOR TO FURTHER REFILLS) 90 tablet 1   losartan  (COZAAR ) 100 MG tablet TAKE 1 TABLET EVERY DAY 90 tablet 1   metFORMIN  (GLUCOPHAGE ) 1000 MG tablet TAKE 1 TABLET TWICE DAILY WITH MEALS 180 tablet 1   Multiple Vitamins-Minerals (CENTRUM ADULTS PO) Take 1 capsule by mouth daily.     Omega-3 Fatty Acids (FISH OIL) 1000 MG CAPS Take 1,000 mg by mouth daily.      rosuvastatin  (CRESTOR ) 20 MG tablet TAKE 1 TABLET EVERY DAY 90 tablet 3   sildenafil  (VIAGRA ) 50 MG tablet TAKE 1/2 TABLET (25 MG TOTAL) BY MOUTH DAILY AS NEEDED FOR ERECTILE DYSFUNCTION. 90 tablet 0   No current facility-administered medications for this visit.    No Known Allergies  Family History  Problem Relation Age of Onset   Hyperlipidemia Sister    Heart disease Brother        heart transplant   Kidney cancer Brother    Depression Neg Hx    Stroke Neg Hx     Social History   Socioeconomic History   Marital status: Divorced     Spouse name: Not on file   Number of children: 3   Years of education: Not on file   Highest education level: Some college, no degree  Occupational History   Occupation: Retired  Tobacco Use   Smoking status: Former    Types: Cigarettes   Smokeless tobacco: Never  Vaping Use   Vaping status: Never Used  Substance and Sexual Activity   Alcohol use: Yes    Alcohol/week: 0.0 standard drinks of alcohol    Comment: occasional   Drug use: No   Sexual activity: Yes  Other Topics Concern   Not on file  Social History Narrative   Not on file   Social Drivers of Health   Financial Resource Strain: Low Risk  (09/02/2024)   Overall Financial Resource Strain (CARDIA)    Difficulty of Paying Living Expenses: Not very hard  Food Insecurity: No Food Insecurity (09/02/2024)   Hunger Vital Sign    Worried About Running Out of Food in the Last Year: Never true    Ran Out of Food in the Last Year: Never true  Transportation Needs: No Transportation Needs (09/02/2024)   PRAPARE - Administrator, Civil Service (Medical): No    Lack of Transportation (Non-Medical): No  Physical Activity: Insufficiently Active (09/02/2024)  Exercise Vital Sign    Days of Exercise per Week: 2 days    Minutes of Exercise per Session: 20 min  Stress: No Stress Concern Present (09/02/2024)   [employer] of Occupational Health - Occupational Stress Questionnaire    Feeling of Stress: Not at all  Social Connections: Moderately Isolated (09/02/2024)   Social Connection and Isolation Panel    Frequency of Communication with Friends and Family: Once a week    Frequency of Social Gatherings with Friends and Family: More than three times a week    Attends Religious Services: More than 4 times per year    Active Member of [employer] or Organizations: No    Attends Engineer, structural: Not on file    Marital Status: Divorced  Intimate Partner Violence: Not At Risk (08/10/2022)   Humiliation,  Afraid, Rape, and Kick questionnaire    Fear of Current or Ex-Partner: No    Emotionally Abused: No    Physically Abused: No    Sexually Abused: No     Constitutional: Denies fever, malaise, fatigue, headache or abrupt weight changes.  HEENT: Denies eye pain, eye redness, ear pain, ringing in the ears, wax buildup, runny nose, nasal congestion, bloody nose, or sore throat. Respiratory: Denies difficulty breathing, shortness of breath, cough or sputum production.   Cardiovascular: Denies chest pain, chest tightness, palpitations or swelling in the hands or feet.  Gastrointestinal: Pt reports intermittent reflux. Denies abdominal pain, bloating, constipation, diarrhea or blood in the stool.  GU: Patient reports erectile dysfunction.  Denies urgency, frequency, pain with urination, burning sensation, blood in urine, odor or discharge. Musculoskeletal: Denies decrease in range of motion, difficulty with gait, muscle pain or joint pain and swelling.  Skin: Denies redness, rashes, lesions or ulcercations.  Neurological: Denies dizziness, difficulty with memory, difficulty with speech or problems with balance and coordination.  Psych: Denies anxiety, depression, SI/HI.  No other specific complaints in a complete review of systems (except as listed in HPI above).  Objective:   Physical Exam BP 130/74 (BP Location: Left Arm, Patient Position: Sitting, Cuff Size: Normal)   Ht 5' 9 (1.753 m)   Wt 192 lb (87.1 kg)   BMI 28.35 kg/m    Wt Readings from Last 3 Encounters:  03/08/24 188 lb 6.4 oz (85.5 kg)  09/05/23 190 lb (86.2 kg)  05/27/23 186 lb 3.2 oz (84.5 kg)    General: Appears his stated age, overweight, in NAD. Skin: Warm, dry and intact. No ulcerations noted. HEENT: Head: normal shape and size; Eyes: sclera white, no icterus, conjunctiva pink, PERRLA and EOMs intact;  Neck:  Neck supple, trachea midline. No masses, lumps or thyromegaly present.  Cardiovascular: Normal rate and  rhythm. S1,S2 noted.  No murmur, rubs or gallops noted. No JVD or BLE edema. No carotid bruits noted. Pulmonary/Chest: Normal effort and positive vesicular breath sounds. No respiratory distress. No wheezes, rales or ronchi noted.  Abdomen: Soft and nontender. Normal bowel sounds.  Musculoskeletal: Strength 5/5 BUE/BLE.  No difficulty with gait.  Neurological: Alert and oriented. Cranial nerves II-XII grossly intact. Coordination normal.  Psychiatric: Mood and affect normal. Behavior is normal. Judgment and thought content normal.     BMET    Component Value Date/Time   NA 140 03/08/2024 0930   K 4.4 03/08/2024 0930   CL 107 03/08/2024 0930   CO2 22 03/08/2024 0930   GLUCOSE 135 (H) 03/08/2024 0930   BUN 22 03/08/2024 0930   CREATININE 1.15 03/08/2024 0930  CALCIUM  9.2 03/08/2024 0930    Lipid Panel     Component Value Date/Time   CHOL 102 03/08/2024 0930   TRIG 308 (H) 03/08/2024 0930   HDL 39 (L) 03/08/2024 0930   CHOLHDL 2.6 03/08/2024 0930   VLDL 69.6 (H) 09/14/2019 1353   LDLCALC 31 03/08/2024 0930    CBC    Component Value Date/Time   WBC 10.0 03/08/2024 0930   RBC 4.89 03/08/2024 0930   HGB 14.4 03/08/2024 0930   HCT 43.7 03/08/2024 0930   PLT 250 03/08/2024 0930   MCV 89.4 03/08/2024 0930   MCH 29.4 03/08/2024 0930   MCHC 33.0 03/08/2024 0930   RDW 14.5 03/08/2024 0930    Hgb A1C Lab Results  Component Value Date   HGBA1C 7.1 (H) 03/08/2024            Assessment & Plan:   Preventative health maintenance:  Flu shot today Tetanus UTD Encouraged him to get his COVID booster Pneumovax and Prevnar UTD Discussed Shingrix vaccine, he will check coverage with his insurance company and schedule visit if he would like to have this done Colon screening UTD Encouraged him to consume a balanced diet and exercise regimen Advised him to see an eye doctor and dentist annually We will check CBC, c-Met, lipid, A1c , urine icroalbuminand PSA today  RTC  in 6 months, follow-up chronic conditions [place] Laura, NP

## 2024-09-05 NOTE — Assessment & Plan Note (Signed)
 Encouraged diet and exercise for weight loss ?

## 2024-09-05 NOTE — Patient Instructions (Signed)
 Health Maintenance, Male  Adopting a healthy lifestyle and getting preventive care are important in promoting health and wellness. Ask your health care provider about:  The right schedule for you to have regular tests and exams.  Things you can do on your own to prevent diseases and keep yourself healthy.  What should I know about diet, weight, and exercise?  Eat a healthy diet    Eat a diet that includes plenty of vegetables, fruits, low-fat dairy products, and lean protein.  Do not eat a lot of foods that are high in solid fats, added sugars, or sodium.  Maintain a healthy weight  Body mass index (BMI) is a measurement that can be used to identify possible weight problems. It estimates body fat based on height and weight. Your health care provider can help determine your BMI and help you achieve or maintain a healthy weight.  Get regular exercise  Get regular exercise. This is one of the most important things you can do for your health. Most adults should:  Exercise for at least 150 minutes each week. The exercise should increase your heart rate and make you sweat (moderate-intensity exercise).  Do strengthening exercises at least twice a week. This is in addition to the moderate-intensity exercise.  Spend less time sitting. Even light physical activity can be beneficial.  Watch cholesterol and blood lipids  Have your blood tested for lipids and cholesterol at 74 years of age, then have this test every 5 years.  You may need to have your cholesterol levels checked more often if:  Your lipid or cholesterol levels are high.  You are older than 74 years of age.  You are at high risk for heart disease.  What should I know about cancer screening?  Many types of cancers can be detected early and may often be prevented. Depending on your health history and family history, you may need to have cancer screening at various ages. This may include screening for:  Colorectal cancer.  Prostate cancer.  Skin cancer.  Lung  cancer.  What should I know about heart disease, diabetes, and high blood pressure?  Blood pressure and heart disease  High blood pressure causes heart disease and increases the risk of stroke. This is more likely to develop in people who have high blood pressure readings or are overweight.  Talk with your health care provider about your target blood pressure readings.  Have your blood pressure checked:  Every 3-5 years if you are 24-52 years of age.  Every year if you are 3 years old or older.  If you are between the ages of 60 and 72 and are a current or former smoker, ask your health care provider if you should have a one-time screening for abdominal aortic aneurysm (AAA).  Diabetes  Have regular diabetes screenings. This checks your fasting blood sugar level. Have the screening done:  Once every three years after age 66 if you are at a normal weight and have a low risk for diabetes.  More often and at a younger age if you are overweight or have a high risk for diabetes.  What should I know about preventing infection?  Hepatitis B  If you have a higher risk for hepatitis B, you should be screened for this virus. Talk with your health care provider to find out if you are at risk for hepatitis B infection.  Hepatitis C  Blood testing is recommended for:  Everyone born from [birth year] through 1966.  Anyone  with known risk factors for hepatitis C.  Sexually transmitted infections (STIs)  You should be screened each year for STIs, including gonorrhea and chlamydia, if:  You are sexually active and are younger than 74 years of age.  You are older than 74 years of age and your health care provider tells you that you are at risk for this type of infection.  Your sexual activity has changed since you were last screened, and you are at increased risk for chlamydia or gonorrhea. Ask your health care provider if you are at risk.  Ask your health care provider about whether you are at high risk for HIV. Your health care provider  may recommend a prescription medicine to help prevent HIV infection. If you choose to take medicine to prevent HIV, you should first get tested for HIV. You should then be tested every 3 months for as long as you are taking the medicine.  Follow these instructions at home:  Alcohol use  Do not drink alcohol if your health care provider tells you not to drink.  If you drink alcohol:  Limit how much you have to 0-2 drinks a day.  Know how much alcohol is in your drink. In the [place] one drink equals one 12 oz bottle of beer (355 mL), one 5 oz glass of wine (148 mL), or one 1 oz glass of hard liquor (44 mL).  Lifestyle  Do not use any products that contain nicotine or tobacco. These products include cigarettes, chewing tobacco, and vaping devices, such as e-cigarettes. If you need help quitting, ask your health care provider.  Do not use street drugs.  Do not share needles.  Ask your health care provider for help if you need support or information about quitting drugs.  General instructions  Schedule regular health, dental, and eye exams.  Stay current with your vaccines.  Tell your health care provider if:  You often feel depressed.  You have ever been abused or do not feel safe at home.  Summary  Adopting a healthy lifestyle and getting preventive care are important in promoting health and wellness.  Follow your health care provider's instructions about healthy diet, exercising, and getting tested or screened for diseases.  Follow your health care provider's instructions on monitoring your cholesterol and blood pressure.  This information is not intended to replace advice given to you by your health care provider. Make sure you discuss any questions you have with your health care provider.  Document Revised: 03/23/2021 Document Reviewed: 03/23/2021  Elsevier Patient Education  2024 [employer].

## 2024-09-06 ENCOUNTER — Ambulatory Visit: Payer: Self-pay | Admitting: Internal Medicine

## 2024-09-06 LAB — COMPREHENSIVE METABOLIC PANEL WITH GFR
AG Ratio: 1.8 (calc) (ref 1.0–2.5)
ALT: 61 U/L — ABNORMAL HIGH (ref 9–46)
AST: 43 U/L — ABNORMAL HIGH (ref 10–35)
Albumin: 4.6 g/dL (ref 3.6–5.1)
Alkaline phosphatase (APISO): 48 U/L (ref 35–144)
BUN/Creatinine Ratio: 25 (calc) — ABNORMAL HIGH (ref 6–22)
BUN: 30 mg/dL — ABNORMAL HIGH (ref 7–25)
CO2: 24 mmol/L (ref 20–32)
Calcium: 10.1 mg/dL (ref 8.6–10.3)
Chloride: 102 mmol/L (ref 98–110)
Creat: 1.22 mg/dL (ref 0.70–1.28)
Globulin: 2.5 g/dL (ref 1.9–3.7)
Glucose, Bld: 146 mg/dL — ABNORMAL HIGH (ref 65–99)
Potassium: 4.8 mmol/L (ref 3.5–5.3)
Sodium: 138 mmol/L (ref 135–146)
Total Bilirubin: 1.5 mg/dL — ABNORMAL HIGH (ref 0.2–1.2)
Total Protein: 7.1 g/dL (ref 6.1–8.1)
eGFR: 62 mL/min/1.73m2 (ref >=60)

## 2024-09-06 LAB — HEMOGLOBIN A1C
Hgb A1c MFr Bld: 7.8 % — ABNORMAL HIGH (ref <5.7)
Mean Plasma Glucose: 177 mg/dL
eAG (mmol/L): 9.8 mmol/L

## 2024-09-06 LAB — MICROALBUMIN / CREATININE URINE RATIO
Creatinine, Urine: 128 mg/dL (ref 20–320)
Microalb Creat Ratio: 5 mg/g{creat} (ref <30)
Microalb, Ur: 0.7 mg/dL

## 2024-09-06 LAB — LIPID PANEL
Cholesterol: 78 mg/dL (ref <200)
HDL: 37 mg/dL — ABNORMAL LOW (ref >=40)
Non-HDL Cholesterol (Calc): 41 mg/dL (ref <130)
Total CHOL/HDL Ratio: 41 mg/dL (ref <5.0)
Total CHOL/HDL Ratio: <10 mg/dL (ref <5.0)
Triglycerides: 271 mg/dL — ABNORMAL HIGH (ref <150)

## 2024-09-06 LAB — CBC
HCT: 45.3 % (ref 38.5–50.0)
Hemoglobin: 14.8 g/dL (ref 13.2–17.1)
MCH: 29.5 pg (ref 27.0–33.0)
MCHC: 32.7 g/dL (ref 32.0–36.0)
MCV: 90.2 fL (ref 80.0–100.0)
MPV: 11.2 fL (ref 7.5–12.5)
Platelets: 240 Thousand/uL (ref 140–400)
RBC: 5.02 Million/uL (ref 4.20–5.80)
RDW: 13.6 % (ref 11.0–15.0)
WBC: 9.4 Thousand/uL (ref 3.8–10.8)

## 2024-09-06 LAB — PSA: PSA: 1.12 ng/mL (ref <=4.00)

## 2024-09-06 MED ORDER — ROSUVASTATIN CALCIUM 40 MG PO TABS: 40.0000 mg | ORAL_TABLET | Freq: Every day | ORAL | 1 refills | Status: DC

## 2024-09-06 MED ORDER — ROSUVASTATIN CALCIUM 40 MG PO TABS
40.0000 mg | ORAL_TABLET | Freq: Every day | ORAL | 1 refills | Status: AC
Start: 2024-09-06 — End: ?

## 2024-09-06 NOTE — Addendum Note (Signed)
 Addended by: ANTONETTE ANGELINE ORN on: 09/06/2024 09:59 AM   Modules accepted: Orders

## 2024-09-06 NOTE — Telephone Encounter (Signed)
 Requested Prescriptions  Pending Prescriptions Disp Refills   losartan  (COZAAR ) 100 MG tablet [Pharmacy Med Name: LOSARTAN  POTASSIUM 100 MG Oral Tablet] 90 tablet 1    Sig: TAKE 1 TABLET EVERY DAY     Cardiovascular:  Angiotensin Receptor Blockers Passed - 09/06/2024  4:35 PM      Passed - Cr in normal range and within 180 days    Creat  Date Value Ref Range Status  09/05/2024 1.22 0.70 - 1.28 mg/dL Final   Creatinine, Urine  Date Value Ref Range Status  09/05/2024 128 20 - 320 mg/dL Final         Passed - K in normal range and within 180 days    Potassium  Date Value Ref Range Status  09/05/2024 4.8 3.5 - 5.3 mmol/L Final         Passed - Patient is not pregnant      Passed - Last BP in normal range    BP Readings from Last 1 Encounters:  09/05/24 130/74         Passed - Valid encounter within last 6 months    Recent Outpatient Visits           Yesterday Encounter for general adult medical examination with abnormal findings   [facility] [place], Angeline ORN, NP   6 months ago Type 2 diabetes mellitus with hyperglycemia, without long-term current use of insulin [facility])   [facility] [place], [place], NP               metFORMIN  (GLUCOPHAGE ) 1000 MG tablet [Pharmacy Med Name: METFORMIN  HYDROCHLORIDE 1000 MG Oral Tablet] 180 tablet 1    Sig: TAKE 1 TABLET TWICE DAILY WITH MEALS     Endocrinology:  Diabetes - Biguanides Failed - 09/06/2024  4:35 PM      Failed - B12 Level in normal range and within 720 days    No results found for: VITAMINB12       Failed - CBC within normal limits and completed in the last 12 months    WBC  Date Value Ref Range Status  09/05/2024 9.4 3.8 - 10.8 Thousand/uL Final   RBC  Date Value Ref Range Status  09/05/2024 5.02 4.20 - 5.80 Million/uL Final   Hemoglobin  Date Value Ref Range Status  09/05/2024 14.8 13.2 - 17.1 g/dL Final   HCT  Date Value Ref Range Status  09/05/2024  45.3 38.5 - 50.0 % Final   MCHC  Date Value Ref Range Status  09/05/2024 32.7 32.0 - 36.0 g/dL Final    Comment:    For adults, a slight decrease in the calculated MCHC value (in the range of 30 to 32 g/dL) is most likely not clinically significant; however, it should be interpreted with caution in correlation with other red cell parameters and the patient's clinical condition.    [facility]  Date Value Ref Range Status  09/05/2024 29.5 27.0 - 33.0 pg Final   MCV  Date Value Ref Range Status  09/05/2024 90.2 80.0 - 100.0 fL Final   No results found for: PLTCOUNTKUC, LABPLAT, POCPLA RDW  Date Value Ref Range Status  09/05/2024 13.6 11.0 - 15.0 % Final         Passed - Cr in normal range and within 360 days    Creat  Date Value Ref Range Status  09/05/2024 1.22 0.70 - 1.28 mg/dL Final   Creatinine, Urine  Date Value Ref Range  Status  09/05/2024 128 20 - 320 mg/dL Final         Passed - HBA1C is between 0 and 7.9 and within 180 days    Hgb A1c MFr Bld  Date Value Ref Range Status  09/05/2024 7.8 (H) <5.7 % Final    Comment:    For someone without known diabetes, a hemoglobin A1c value of 6.5% or greater indicates that they may have  diabetes and this should be confirmed with a follow-up  test. . For someone with known diabetes, a value <7% indicates  that their diabetes is well controlled and a value  greater than or equal to 7% indicates suboptimal  control. A1c targets should be individualized based on  duration of diabetes, age, comorbid conditions, and  other considerations. . Currently, no consensus exists regarding use of hemoglobin A1c for diagnosis of diabetes for children. .          Passed - eGFR in normal range and within 360 days    GFR  Date Value Ref Range Status  06/23/2020 61.60 >60.00 mL/min Final   eGFR  Date Value Ref Range Status  09/05/2024 62 > OR = 60 mL/min/1.30m2 Final         Passed - Valid encounter within last 6 months     Recent Outpatient Visits           Yesterday Encounter for general adult medical examination with abnormal findings   [facility] [place], [place], NP   6 months ago Type 2 diabetes mellitus with hyperglycemia, without long-term current use of insulin [facility])   [facility] [place], Angeline ORN, [place]

## 2024-09-14 DIAGNOSIS — H43811 Vitreous degeneration, right eye: Secondary | ICD-10-CM | POA: Diagnosis not present

## 2024-09-14 DIAGNOSIS — H2513 Age-related nuclear cataract, bilateral: Secondary | ICD-10-CM | POA: Diagnosis not present

## 2024-09-14 DIAGNOSIS — E113311 Type 2 diabetes mellitus with moderate nonproliferative diabetic retinopathy with macular edema, right eye: Secondary | ICD-10-CM | POA: Diagnosis not present

## 2024-09-14 DIAGNOSIS — H35431 Paving stone degeneration of retina, right eye: Secondary | ICD-10-CM | POA: Diagnosis not present

## 2024-10-24 DIAGNOSIS — H5371 Glare sensitivity: Secondary | ICD-10-CM | POA: Diagnosis not present

## 2024-10-24 DIAGNOSIS — H2512 Age-related nuclear cataract, left eye: Secondary | ICD-10-CM | POA: Diagnosis not present

## 2024-11-20 ENCOUNTER — Encounter: Payer: Self-pay | Admitting: Internal Medicine

## 2024-11-20 ENCOUNTER — Other Ambulatory Visit: Payer: Self-pay

## 2024-11-20 MED ORDER — SILDENAFIL CITRATE 50 MG PO TABS: 50.0000 mg | ORAL_TABLET | ORAL | 0 refills | Status: AC | PRN

## 2025-03-06 ENCOUNTER — Ambulatory Visit: Admitting: Internal Medicine
